# Patient Record
Sex: Female | Born: 1950
Health system: Southern US, Community
[De-identification: ages and names within clinical notes are randomized; demographics above are authoritative.]

## PROBLEM LIST (undated history)

## (undated) DIAGNOSIS — F320 Major depressive disorder, single episode, mild: Secondary | ICD-10-CM

## (undated) DIAGNOSIS — E785 Hyperlipidemia, unspecified: Secondary | ICD-10-CM

## (undated) DIAGNOSIS — H9193 Unspecified hearing loss, bilateral: Secondary | ICD-10-CM

## (undated) DIAGNOSIS — M545 Low back pain, unspecified: Secondary | ICD-10-CM

## (undated) DIAGNOSIS — N3010 Interstitial cystitis (chronic) without hematuria: Secondary | ICD-10-CM

## (undated) DIAGNOSIS — I10 Essential (primary) hypertension: Secondary | ICD-10-CM

## (undated) DIAGNOSIS — F419 Anxiety disorder, unspecified: Secondary | ICD-10-CM

## (undated) DIAGNOSIS — K219 Gastro-esophageal reflux disease without esophagitis: Secondary | ICD-10-CM

## (undated) DIAGNOSIS — G8929 Other chronic pain: Secondary | ICD-10-CM

## (undated) DIAGNOSIS — M1990 Unspecified osteoarthritis, unspecified site: Secondary | ICD-10-CM

## (undated) DIAGNOSIS — H902 Conductive hearing loss, unspecified: Secondary | ICD-10-CM

## (undated) DIAGNOSIS — D320 Benign neoplasm of cerebral meninges: Secondary | ICD-10-CM

## (undated) DIAGNOSIS — H9012 Conductive hearing loss, unilateral, left ear, with unrestricted hearing on the contralateral side: Secondary | ICD-10-CM

## (undated) DIAGNOSIS — R531 Weakness: Secondary | ICD-10-CM

## (undated) DIAGNOSIS — I5189 Other ill-defined heart diseases: Secondary | ICD-10-CM

## (undated) HISTORY — PX: TOTAL HIP ARTHROPLASTY: SHX124

## (undated) HISTORY — PX: CHOLECYSTECTOMY: SHX55

## (undated) HISTORY — PX: BACK SURGERY: SHX140

## (undated) HISTORY — PX: REPLACEMENT TOTAL KNEE: SUR1224

## (undated) HISTORY — DX: Benign neoplasm of cerebral meninges: D32.0

## (undated) HISTORY — PX: ABDOMINAL HYSTERECTOMY: SHX81

## (undated) HISTORY — DX: Other ill-defined heart diseases: I51.89

## (undated) HISTORY — DX: Essential (primary) hypertension: I10

---

## 1998-08-03 ENCOUNTER — Ambulatory Visit (HOSPITAL_BASED_OUTPATIENT_CLINIC_OR_DEPARTMENT_OTHER): Admission: RE | Admit: 1998-08-03 | Discharge: 1998-08-03 | Payer: Self-pay | Admitting: Orthopedic Surgery

## 1998-11-27 ENCOUNTER — Ambulatory Visit (HOSPITAL_BASED_OUTPATIENT_CLINIC_OR_DEPARTMENT_OTHER): Admission: RE | Admit: 1998-11-27 | Discharge: 1998-11-27 | Payer: Self-pay | Admitting: Urology

## 1999-08-23 ENCOUNTER — Ambulatory Visit (HOSPITAL_COMMUNITY): Admission: RE | Admit: 1999-08-23 | Discharge: 1999-08-23 | Payer: Self-pay | Admitting: Urology

## 1999-09-17 ENCOUNTER — Encounter: Admission: RE | Admit: 1999-09-17 | Discharge: 1999-09-17 | Payer: Self-pay | Admitting: Urology

## 2001-03-27 ENCOUNTER — Encounter: Payer: Self-pay | Admitting: Internal Medicine

## 2001-03-27 ENCOUNTER — Ambulatory Visit (HOSPITAL_COMMUNITY): Admission: RE | Admit: 2001-03-27 | Discharge: 2001-03-27 | Payer: Self-pay | Admitting: Internal Medicine

## 2001-09-10 ENCOUNTER — Ambulatory Visit (HOSPITAL_COMMUNITY): Admission: RE | Admit: 2001-09-10 | Discharge: 2001-09-10 | Payer: Self-pay | Admitting: Urology

## 2002-09-21 ENCOUNTER — Ambulatory Visit (HOSPITAL_BASED_OUTPATIENT_CLINIC_OR_DEPARTMENT_OTHER): Admission: RE | Admit: 2002-09-21 | Discharge: 2002-09-21 | Payer: Self-pay | Admitting: Urology

## 2003-02-18 ENCOUNTER — Encounter: Payer: Self-pay | Admitting: Orthopedic Surgery

## 2003-02-18 ENCOUNTER — Encounter: Admission: RE | Admit: 2003-02-18 | Discharge: 2003-02-18 | Payer: Self-pay | Admitting: Orthopedic Surgery

## 2003-09-01 ENCOUNTER — Encounter: Payer: Self-pay | Admitting: Internal Medicine

## 2003-09-01 ENCOUNTER — Ambulatory Visit (HOSPITAL_COMMUNITY): Admission: RE | Admit: 2003-09-01 | Discharge: 2003-09-01 | Payer: Self-pay | Admitting: Internal Medicine

## 2004-01-20 ENCOUNTER — Encounter (INDEPENDENT_AMBULATORY_CARE_PROVIDER_SITE_OTHER): Payer: Self-pay | Admitting: *Deleted

## 2004-01-20 ENCOUNTER — Observation Stay (HOSPITAL_COMMUNITY): Admission: RE | Admit: 2004-01-20 | Discharge: 2004-01-21 | Payer: Self-pay | Admitting: Orthopedic Surgery

## 2004-02-04 ENCOUNTER — Emergency Department (HOSPITAL_COMMUNITY): Admission: EM | Admit: 2004-02-04 | Discharge: 2004-02-04 | Payer: Self-pay | Admitting: Emergency Medicine

## 2004-05-22 ENCOUNTER — Encounter (HOSPITAL_COMMUNITY): Admission: RE | Admit: 2004-05-22 | Discharge: 2004-06-21 | Payer: Self-pay | Admitting: Orthopedic Surgery

## 2004-10-19 ENCOUNTER — Ambulatory Visit (HOSPITAL_COMMUNITY): Admission: RE | Admit: 2004-10-19 | Discharge: 2004-10-19 | Payer: Self-pay | Admitting: Neurosurgery

## 2004-11-17 ENCOUNTER — Emergency Department (HOSPITAL_COMMUNITY): Admission: EM | Admit: 2004-11-17 | Discharge: 2004-11-17 | Payer: Self-pay | Admitting: Emergency Medicine

## 2005-01-28 ENCOUNTER — Encounter: Admission: RE | Admit: 2005-01-28 | Discharge: 2005-01-28 | Payer: Self-pay | Admitting: Internal Medicine

## 2008-04-20 ENCOUNTER — Inpatient Hospital Stay (HOSPITAL_COMMUNITY): Admission: RE | Admit: 2008-04-20 | Discharge: 2008-04-23 | Payer: Self-pay | Admitting: Orthopedic Surgery

## 2008-05-16 ENCOUNTER — Encounter (HOSPITAL_COMMUNITY): Admission: RE | Admit: 2008-05-16 | Discharge: 2008-06-15 | Payer: Self-pay | Admitting: Orthopedic Surgery

## 2008-06-16 ENCOUNTER — Encounter (HOSPITAL_COMMUNITY): Admission: RE | Admit: 2008-06-16 | Discharge: 2008-07-16 | Payer: Self-pay | Admitting: Orthopedic Surgery

## 2008-12-03 ENCOUNTER — Emergency Department (HOSPITAL_COMMUNITY): Admission: EM | Admit: 2008-12-03 | Discharge: 2008-12-03 | Payer: Self-pay | Admitting: Emergency Medicine

## 2009-03-02 IMAGING — CR DG KNEE 1-2V PORT*R*
2 series · 2 of 2 positions shown · non-contrast
Comparison: None

CLINICAL DATA: Status post right total knee arthroplasty.

PORTABLE RIGHT KNEE - 1-2 VIEW

[view not recorded (1 of 2)]
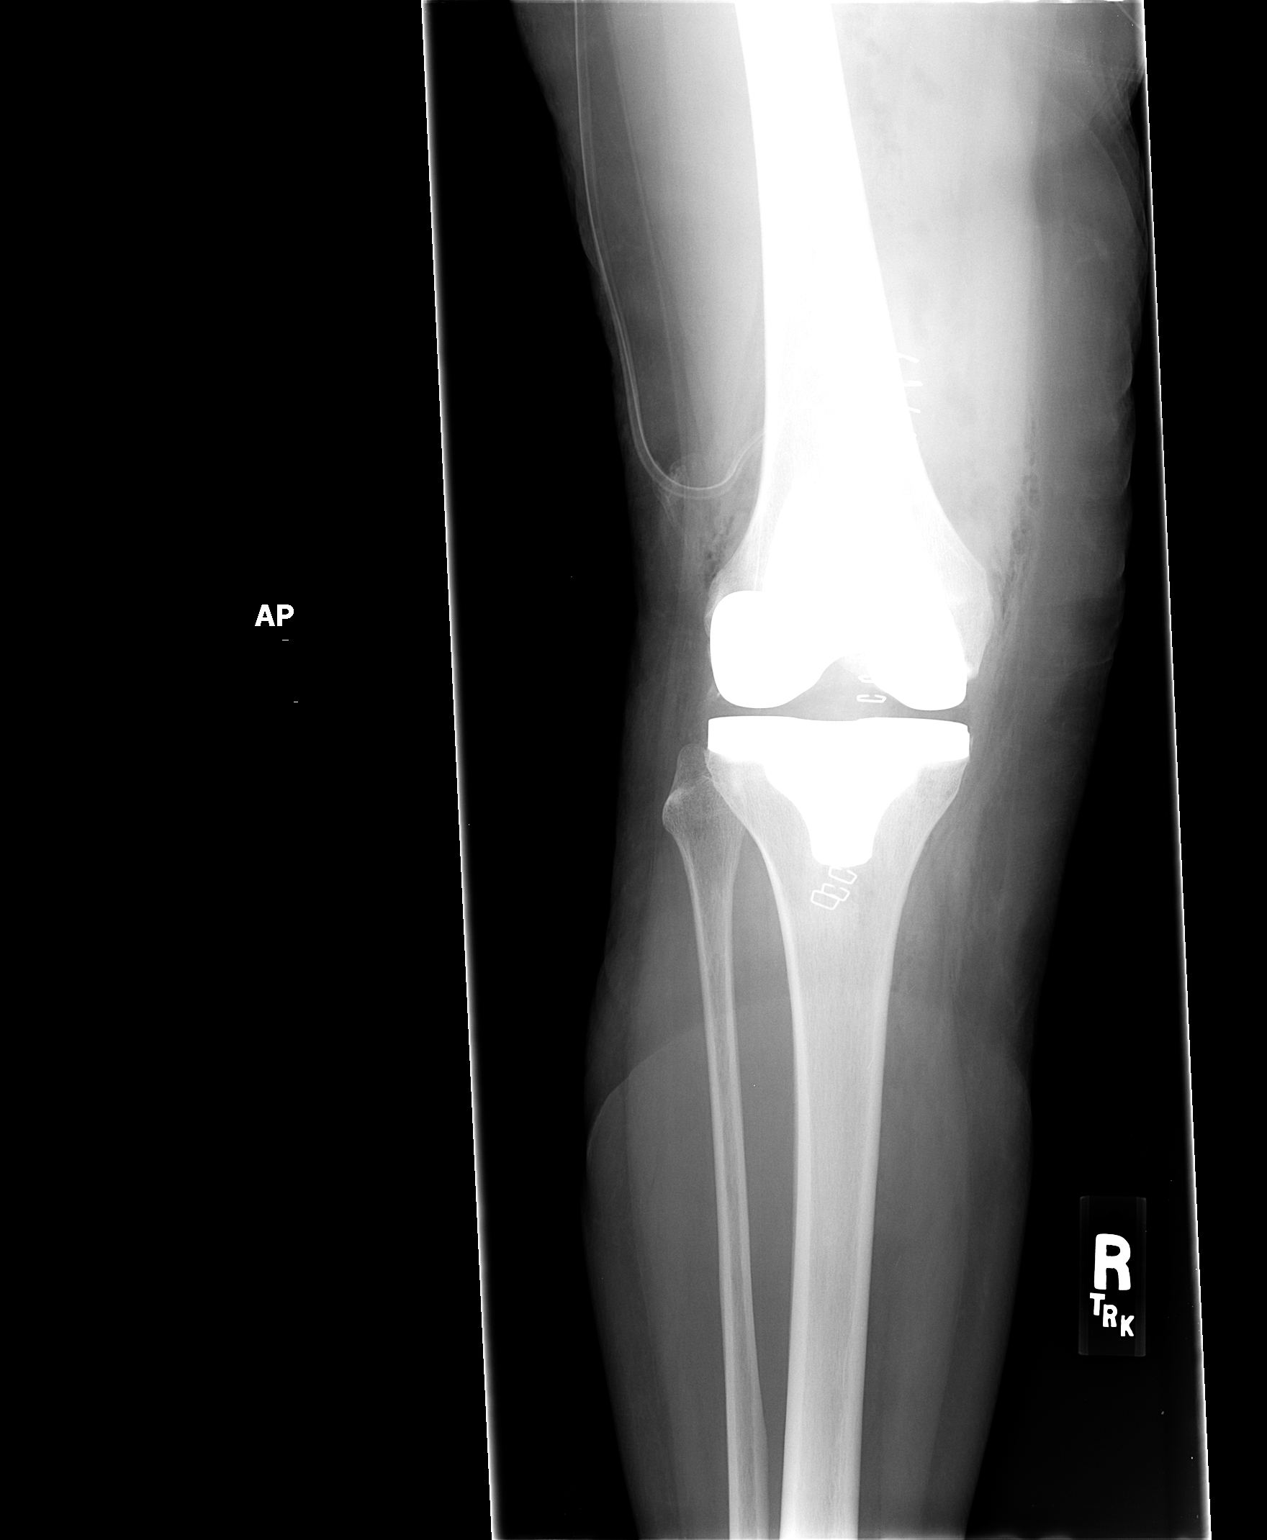

[view not recorded (2 of 2)]
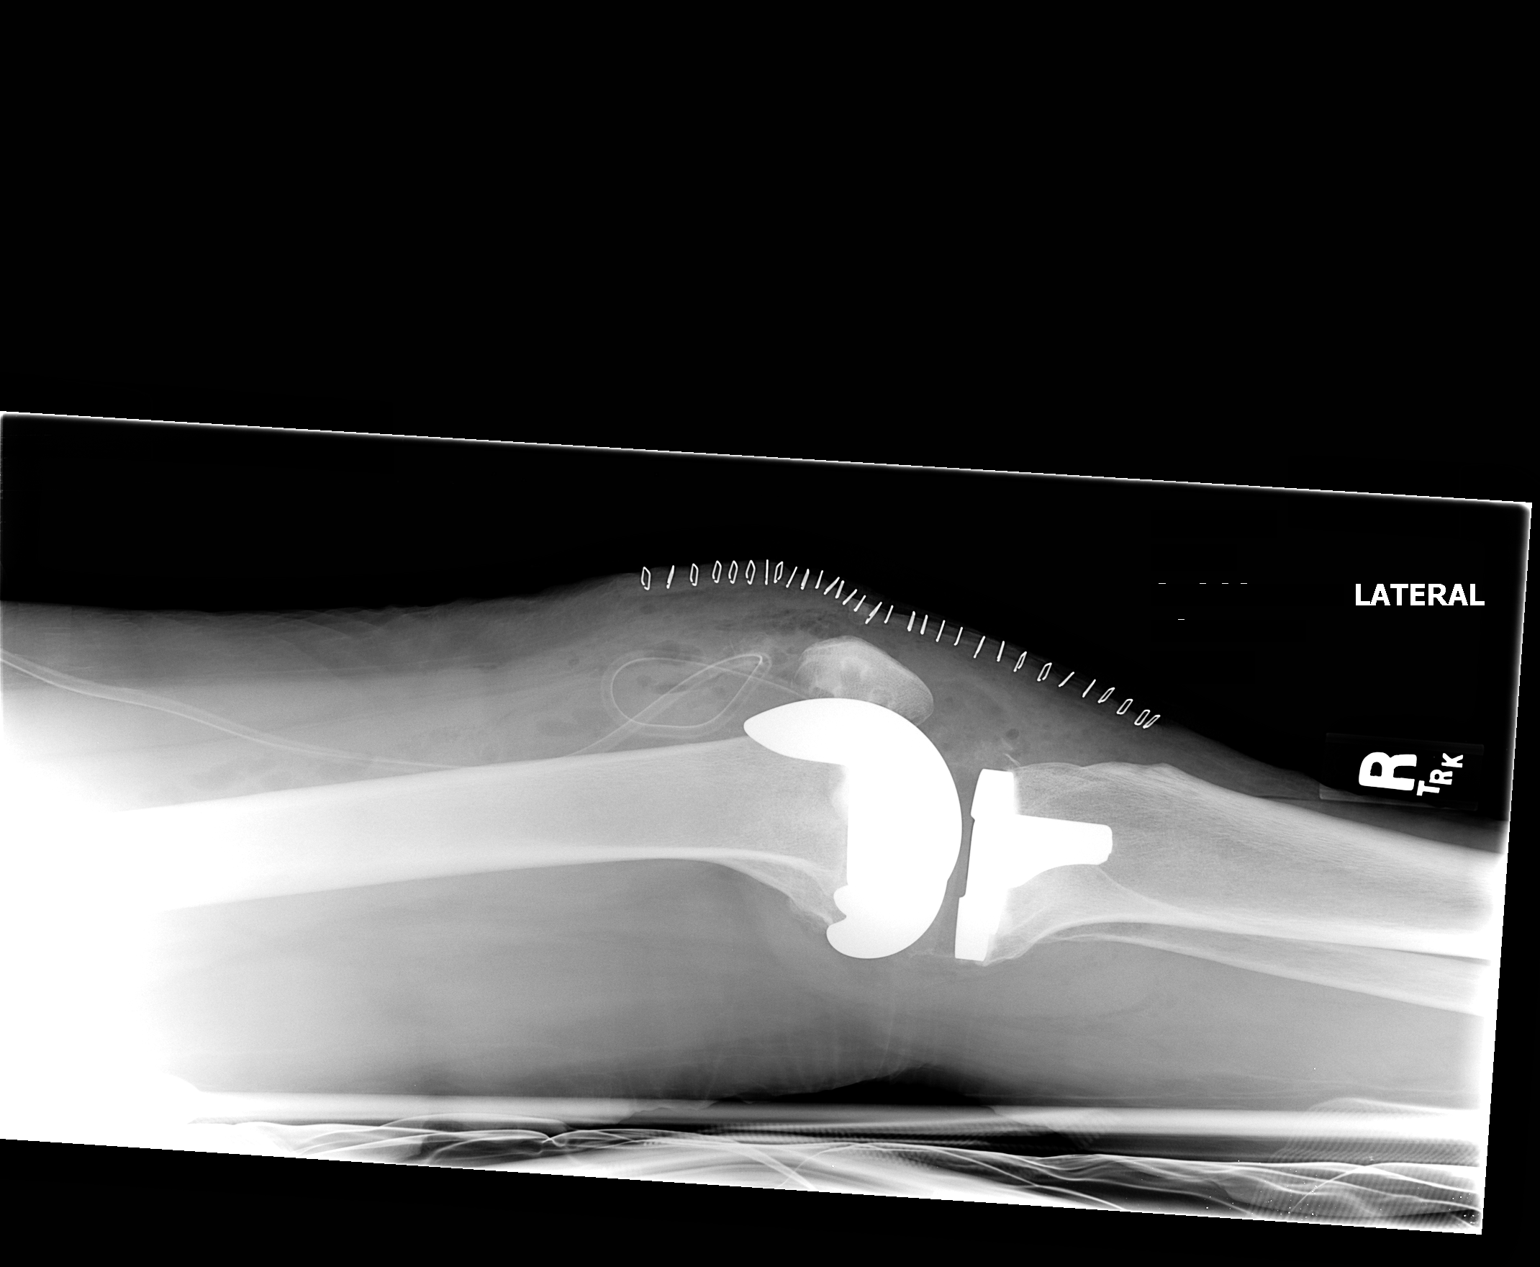

[2 of 2 positions shown; findings below may reference images not displayed]

FINDINGS: The hardware components of a right total knee
arthroplasty device are identified.

No complications are identified.  Specifically, no periprosthetic
fracture or dislocation noted.  The alignment is anatomic.
IMPRESSION: 1.  No complicating features after right total knee arthroplasty.

## 2010-03-29 ENCOUNTER — Encounter (HOSPITAL_COMMUNITY): Admission: RE | Admit: 2010-03-29 | Discharge: 2010-04-28 | Payer: Self-pay | Admitting: Anesthesiology

## 2010-07-03 ENCOUNTER — Encounter: Admission: RE | Admit: 2010-07-03 | Discharge: 2010-07-03 | Payer: Self-pay | Admitting: Internal Medicine

## 2010-12-23 ENCOUNTER — Encounter: Payer: Self-pay | Admitting: Internal Medicine

## 2011-01-19 ENCOUNTER — Emergency Department (HOSPITAL_COMMUNITY)
Admission: EM | Admit: 2011-01-19 | Discharge: 2011-01-19 | Disposition: A | Discharge status: Home / Self Care | Payer: 59 | Attending: Emergency Medicine | Admitting: Emergency Medicine

## 2011-01-19 DIAGNOSIS — R5381 Other malaise: Secondary | ICD-10-CM | POA: Insufficient documentation

## 2011-01-19 DIAGNOSIS — N3010 Interstitial cystitis (chronic) without hematuria: Secondary | ICD-10-CM | POA: Insufficient documentation

## 2011-01-19 DIAGNOSIS — IMO0001 Reserved for inherently not codable concepts without codable children: Secondary | ICD-10-CM | POA: Insufficient documentation

## 2011-01-19 DIAGNOSIS — J029 Acute pharyngitis, unspecified: Secondary | ICD-10-CM | POA: Insufficient documentation

## 2011-01-19 DIAGNOSIS — R509 Fever, unspecified: Secondary | ICD-10-CM | POA: Insufficient documentation

## 2011-01-19 LAB — RAPID STREP SCREEN (MED CTR MEBANE ONLY): Streptococcus, Group A Screen (Direct): NEGATIVE

## 2011-01-20 LAB — STREP A DNA PROBE: Group A Strep Probe: NEGATIVE

## 2011-04-16 NOTE — Discharge Summary (Signed)
NAMESUKHMANI, FETHEROLF                ACCOUNT NO.:  [id number]   MEDICAL RECORD NO.:  [id number]          PATIENT TYPE:  INP   LOCATION:  5038                         FACILITY:  [facility]   PHYSICIAN:  Loreta Ave, M.D. DATE OF BIRTH:  23-May-1951   DATE OF ADMISSION:  04/20/2008  DATE OF DISCHARGE:  04/23/2008                               DISCHARGE SUMMARY   FINAL DIAGNOSIS:  Status post right total knee replacement for end-stage  degenerative joint disease.   HOSPITAL COURSE:  On Apr 20, 2008, the patient was taken to the [facility] OR and a right total knee replacement procedure was performed.   SURGEON:  Loreta Ave, MD   ASSISTANT:  Zonia Kief, [facility]   ANESTHESIA:  General.   SPECIMENS:  None.   ESTIMATED BLOOD LOSS:  Minimal.   TOURNIQUET TIME:  One hour and 30 minutes.   DRAINS:  One Hemovac drain placed.   There were no surgical or anesthesia complications and the patient was  transferred to recovery in stable condition.  On Apr 21, 2008, the  patient was doing well.  Vital signs stable and afebrile.  Hemoglobin  10.2 and hematocrit 29.2.  INR 1.1.  Slight bleeding through her  dressing.  Calf nontender.  Neurovascularly intact.  Changed pain med to  Percocet 10/325, 1-2 tabs p.o. q.4-6 h. p.r.n. for pain.  Pharmacy  protocol, Coumadin started.  On Apr 22, 2008, the patient was doing  well.  Good pain control.  Temp 98.4, pulse 102, respirations 20, and  blood pressure 90/50.  Hemoglobin 8.8 and hematocrit 24.8.  INR 1.7.  Wound looks good, staples intact.  No drainage or signs of infection.  Calf nontender.  Neurovascularly intact.  Hemovac drain discontinued.  Started iron sulfate at 325 mg 1 tab p.o. b.i.d. with meals.  Discontinued the PCA and Foley. Saline-locked IV.  The patient did very  well on therapy.  On Apr 23, 2008, the patient without complaints.  Vital signs stable, afebrile.  Hemoglobin 9.0 and hematocrit 25.7.  Wound looks good, staples intact.   No drainage or signs of infection.  She had progressed well and was ready for discharge home.   CONDITION:  Good and stable.   DISPOSITION:  Discharge home.   MEDICATIONS:  1. Percocet 10/325, 1-2 tabs p.o. q.4-6 h. p.r.n. for pain.  2. Robaxin 500 mg one tab p.o. q.6 h. p.r.n. for spasms.  3. Lovenox 40 mg one subcu injection daily and stop when INR is 2-3.  4. Coumadin pharmacy protocol, maintain INR at 2-3.  5. Resume her previous home meds.   INSTRUCTIONS:  The patient will work with home health PT and OT to  improve ambulation and knee range of motion and strengthening.  Daily  dressing changes with 4 x 4 gauze and tape.  Coumadin x4 weeks  postoperative DVT prophylaxis.  Follow up in two weeks for postoperative  recheck.  Return sooner if needed.      Genene Churn. Denton Meek.      Loreta Ave, M.D.  Electronically Signed  JMO/MEDQ  D:  06/13/2008  T:  06/14/2008  Job:  [id number]

## 2011-04-16 NOTE — Op Note (Signed)
NAMESEREN, CHALOUX                ACCOUNT NO.:  [id number]   MEDICAL RECORD NO.:  [id number]          PATIENT TYPE:  INP   LOCATION:  5038                         FACILITY:  [facility]   PHYSICIAN:  Loreta Ave, M.D. DATE OF BIRTH:  1951-05-26   DATE OF PROCEDURE:  04/20/2008  DATE OF DISCHARGE:                               OPERATIVE REPORT   PREOPERATIVE DIAGNOSIS:  End-stage degenerative arthritis, right knee,  varus alignment.   POSTOPERATIVE DIAGNOSIS:  End-stage degenerative arthritis, right knee,  varus alignment.   PROCEDURE:  Right total knee replacement with Stryker Triathlon  prosthesis.  Modified minimally invasive vastus splitting incision.  Cemented pegged posterior stabilized #3 femoral component.  Cemented #3  tibial component with an 11-mm polyethylene insert posterior stabilized.  Resurfacing 29-mm x 9-mm cemented medial offset patellar component.  Soft tissue balancing with medial capsule release.   SURGEON:  Loreta Ave, MD   ASSISTANT:  Genene Churn. Barry Dienes, [place], present throughout the entire case  necessary for timely completion of procedure.   ANESTHESIA:  General.   BLOOD LOSS:  Minimal.   TOURNIQUET TIME:  1 hour 15 minutes.   SPECIMENS:  None.   CONSULTS:  None.   COMPLICATIONS:  None.   DRESSINGS:  Soft compressive knee immobilizer.   DRAIN:  Hemovac x1.   PROCEDURE:  The patient was brought to the operating room and placed on  the operating table in supine position.  After adequate anesthesia had  been obtained, knee examined.  Minimal flexion contracture and varus  alignment reasonably correctable.  Flexion 120 degrees.  Tourniquet  applied.  Prepped and draped in usual sterile fashion.  Exsanguinated,  elevation, Esmarch tourniquet was inflated at 350 mmHg.  Straight  incision above the patella down to tibial tubercle.  Medial arthrotomy  up to the superomedial border of patella and then vastus splitting for a  modified minimally  invasive approach.  Knee exposed.  Grade IV changes  medially.  Not quite as bad, but lesser extent changes in the  patellofemoral and lateral compartments.  Periarticular spurs remnants  of menisci, cruciate ligaments excised.  Medial capsule release.  Distal  femur exposed.  Intramedullary guide placed.  Distal cut was set at 5  degrees of valgus resecting 10 mm.  Using epicondylar axis sized, cut  and fitted for a posterior stabilized #3 component, which gave good  coverage and fitting throughout.  Attention was turned to the tibia.  Extramedullary guide.  Minimal proximal resection of 3-degree posterior  slope cut with extramedullary guide.  Size #3 component.  All recess  examined and all spurs removed.  Back of the femur had been assessed  before a trial had been put in place there.  Trials on the tibia and  femur were #3.  With the 11-mm insert, I had full extension and full  flexion, nicely balanced knee and normal mechanical axis, and no lift  off the flexion.  Utilizing trials, tibia was marked for appropriate  rotation.  Attention was turned to the patella.  Posterior aspect  exposed.  Posterior 9 mm removed with  a saw.  Size drilled and fitted  with a 29-mm patellar component.  Excellent patellofemoral tracking at  completion.  All trials removed.  Copious irrigation with a pulse  irrigating device.  Cement prepared and placed on all components, which  were firmly seated.  Polyethylene attached to tibia and the knee  reduced.  Once the cement had hardened and all the excessive cement  removed, the knee was reexamined.  Full extension, full flexion, good  alignment, good stability, and good patellofemoral tracking.  Wound  irrigated.  Hemovac placed through a separate stab wound  superolaterally.  Arthrotomy closed with #1 Vicryl.  Skin and  subcutaneous tissues with Vicryl and staples.  Knee injected with  Marcaine.  Hemovac clamped.  Sterile compressive dressing applied.   Tourniquet deflated, removed.  Knee immobilizer applied.  The anesthesia  reversed.  Brought to recovery room.  Tolerated surgery well.  No  complications.      Loreta Ave, M.D.  Electronically Signed     DFM/MEDQ  D:  04/21/2008  T:  04/22/2008  Job:  [id number]

## 2011-08-28 LAB — COMPREHENSIVE METABOLIC PANEL
ALT: 56 — ABNORMAL HIGH
AST: 47 — ABNORMAL HIGH
Calcium: 9.5
GFR calc Af Amer: >60
Potassium: 4.6
Sodium: 142
Total Protein: 6.9

## 2011-08-28 LAB — BASIC METABOLIC PANEL
BUN: 4 — ABNORMAL LOW
BUN: 5 — ABNORMAL LOW
CO2: 26
CO2: 27
Calcium: 8.2 — ABNORMAL LOW
Chloride: 106
Chloride: 106
Creatinine, Ser: 0.66
Glucose, Bld: 111 — ABNORMAL HIGH
Glucose, Bld: 128 — ABNORMAL HIGH
Glucose, Bld: 144 — ABNORMAL HIGH
Potassium: 3.7
Sodium: 140

## 2011-08-28 LAB — CBC
HCT: 24.8 — ABNORMAL LOW
HCT: 29.2 — ABNORMAL LOW
Hemoglobin: 8.8 — ABNORMAL LOW
MCHC: 34
MCHC: 35.1
MCHC: 35.3
MCV: 85.8
MCV: 86
Platelets: 155
Platelets: 156
RBC: 4.55
RDW: 13
RDW: 13.4
WBC: 7.5

## 2011-08-28 LAB — URINE MICROSCOPIC-ADD ON

## 2011-08-28 LAB — URINALYSIS, ROUTINE W REFLEX MICROSCOPIC
Glucose, UA: NEGATIVE
Specific Gravity, Urine: 1.013
pH: 5.5

## 2011-08-28 LAB — PROTIME-INR: Prothrombin Time: 18.5 — ABNORMAL HIGH

## 2011-08-28 LAB — ABO/RH: ABO/RH(D): B POS

## 2011-12-19 ENCOUNTER — Telehealth: Payer: Self-pay

## 2011-12-19 NOTE — Telephone Encounter (Signed)
Pt called back and I told her we would be calling her back to get her scheduled.

## 2011-12-19 NOTE — Telephone Encounter (Signed)
LM for pt to call. 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

## 2011-12-23 ENCOUNTER — Other Ambulatory Visit: Payer: Self-pay

## 2011-12-23 DIAGNOSIS — Z139 Encounter for screening, unspecified: Secondary | ICD-10-CM

## 2011-12-23 NOTE — Telephone Encounter (Signed)
Gastroenterology Pre-Procedure Form   Request Date: 12/23/2011                      Requesting Physician: Dr. Dwana Melena     PATIENT INFORMATION:  Rhonda Morales is a 61 y.o., female (DOB=1950/12/21).  PROCEDURE: Procedure(s) requested: colonoscopy Procedure Reason: screening for colon cancer  PATIENT REVIEW QUESTIONS: The patient reports the following:   1. Diabetes Melitis: no 2. Joint replacements in the past 12 months: no 3. Major health problems in the past 3 months: no 4. Has an artificial valve or MVP:no 5. Has been advised in past to take antibiotics in advance of a procedure like teeth cleaning: no}    MEDICATIONS & ALLERGIES:    Patient reports the following regarding taking any blood thinners:   Plavix? no Aspirin?no Coumadin?  no  Patient confirms/reports the following medications:  Current Outpatient Prescriptions  Medication Sig Dispense Refill  . ALPRAZolam (XANAX) 1 MG tablet Take 1 mg by mouth 3 (three) times daily as needed.      . Biotin 10 MG TABS Take by mouth.      . buprenorphine-naloxone (SUBOXONE) 8-2 MG SUBL Place under the tongue 2 (two) times daily.      . Multiple Vitamin (MULTIVITAMIN) tablet Take 1 tablet by mouth daily.        Patient confirms/reports the following allergies:  Allergies  Allergen Reactions  . Morphine And Related     Unsure of type of reaction    Patient is appropriate to schedule for requested procedure(s): yes  AUTHORIZATION INFORMATION Primary Insurance:   ID #:   Group #:  Pre-Cert / Auth required:  Pre-Cert / Auth #:   Secondary Insurance:   ID #:   Group #:  Pre-Cert / Auth required: Pre-Cert / Auth #:   No orders of the defined types were placed in this encounter.    SCHEDULE INFORMATION: Procedure has been scheduled as follows:  Date: 01/06/2012              Time: 9:15 AM  Location: [facility] Short Stay  This Gastroenterology Pre-Precedure Form is being routed to the following provider(s) for  review: Jonette Eva, MD    [facility] for pt to all to give date and time she is scheduled.

## 2011-12-25 NOTE — Telephone Encounter (Signed)
PT NEEDS AN APPT PRIOR TO TCS. PT NEEDS TO BE DONE WITH PROPOFOL DUE TO HER USE OF SUBOXONE.

## 2011-12-25 NOTE — Telephone Encounter (Signed)
Called pt and informed she needs OV appt prior to colonoscopy and will need to be done in OR. Scheduled for OV with Gerrit Halls, NP on 12/31/2011 @ 2:00 pm. Nash Dimmer, [facility] to cancel colonoscopy on 01/06/2012, and asked her to call me and confirm that she received the message.

## 2011-12-31 ENCOUNTER — Ambulatory Visit: Payer: 59 | Admitting: Gastroenterology

## 2012-01-06 ENCOUNTER — Encounter (HOSPITAL_COMMUNITY): Admission: RE | Payer: Self-pay | Source: Ambulatory Visit

## 2012-01-06 ENCOUNTER — Ambulatory Visit (HOSPITAL_COMMUNITY): Admission: RE | Admit: 2012-01-06 | Payer: 59 | Source: Ambulatory Visit | Admitting: Gastroenterology

## 2012-01-06 SURGERY — COLONOSCOPY
Anesthesia: Moderate Sedation

## 2012-03-04 ENCOUNTER — Ambulatory Visit: Payer: 59 | Admitting: Gastroenterology

## 2012-03-25 ENCOUNTER — Ambulatory Visit (HOSPITAL_COMMUNITY)
Admission: RE | Admit: 2012-03-25 | Discharge: 2012-03-25 | Disposition: A | Discharge status: Home / Self Care | Payer: 59 | Source: Ambulatory Visit | Attending: *Deleted | Admitting: *Deleted

## 2012-03-25 ENCOUNTER — Other Ambulatory Visit (HOSPITAL_COMMUNITY): Payer: Self-pay | Admitting: *Deleted

## 2012-03-25 DIAGNOSIS — R55 Syncope and collapse: Secondary | ICD-10-CM | POA: Insufficient documentation

## 2012-03-25 DIAGNOSIS — R51 Headache: Secondary | ICD-10-CM | POA: Insufficient documentation

## 2012-03-25 DIAGNOSIS — R42 Dizziness and giddiness: Secondary | ICD-10-CM

## 2014-01-10 DIAGNOSIS — Z0289 Encounter for other administrative examinations: Secondary | ICD-10-CM

## 2016-01-22 DIAGNOSIS — R262 Difficulty in walking, not elsewhere classified: Secondary | ICD-10-CM | POA: Diagnosis not present

## 2016-01-22 DIAGNOSIS — M1712 Unilateral primary osteoarthritis, left knee: Secondary | ICD-10-CM | POA: Diagnosis not present

## 2016-01-22 DIAGNOSIS — M25562 Pain in left knee: Secondary | ICD-10-CM | POA: Diagnosis not present

## 2016-02-01 DIAGNOSIS — M25562 Pain in left knee: Secondary | ICD-10-CM | POA: Diagnosis not present

## 2016-02-01 DIAGNOSIS — R2689 Other abnormalities of gait and mobility: Secondary | ICD-10-CM | POA: Diagnosis not present

## 2016-02-01 DIAGNOSIS — M1712 Unilateral primary osteoarthritis, left knee: Secondary | ICD-10-CM | POA: Diagnosis not present

## 2016-02-01 DIAGNOSIS — Z96651 Presence of right artificial knee joint: Secondary | ICD-10-CM | POA: Diagnosis not present

## 2016-02-01 DIAGNOSIS — R262 Difficulty in walking, not elsewhere classified: Secondary | ICD-10-CM | POA: Diagnosis not present

## 2016-02-01 DIAGNOSIS — M25661 Stiffness of right knee, not elsewhere classified: Secondary | ICD-10-CM | POA: Diagnosis not present

## 2016-02-06 DIAGNOSIS — F411 Generalized anxiety disorder: Secondary | ICD-10-CM | POA: Diagnosis not present

## 2016-02-06 DIAGNOSIS — R030 Elevated blood-pressure reading, without diagnosis of hypertension: Secondary | ICD-10-CM | POA: Diagnosis not present

## 2016-02-06 DIAGNOSIS — Z6826 Body mass index (BMI) 26.0-26.9, adult: Secondary | ICD-10-CM | POA: Diagnosis not present

## 2016-02-06 DIAGNOSIS — M25562 Pain in left knee: Secondary | ICD-10-CM | POA: Diagnosis not present

## 2016-02-06 DIAGNOSIS — E6609 Other obesity due to excess calories: Secondary | ICD-10-CM | POA: Diagnosis not present

## 2016-02-15 DIAGNOSIS — M1712 Unilateral primary osteoarthritis, left knee: Secondary | ICD-10-CM | POA: Diagnosis not present

## 2016-02-15 DIAGNOSIS — M25562 Pain in left knee: Secondary | ICD-10-CM | POA: Diagnosis not present

## 2016-02-22 DIAGNOSIS — R2689 Other abnormalities of gait and mobility: Secondary | ICD-10-CM | POA: Diagnosis not present

## 2016-02-22 DIAGNOSIS — M25562 Pain in left knee: Secondary | ICD-10-CM | POA: Diagnosis not present

## 2016-02-22 DIAGNOSIS — M1712 Unilateral primary osteoarthritis, left knee: Secondary | ICD-10-CM | POA: Diagnosis not present

## 2016-03-05 DIAGNOSIS — M1712 Unilateral primary osteoarthritis, left knee: Secondary | ICD-10-CM | POA: Diagnosis not present

## 2016-03-05 DIAGNOSIS — M25562 Pain in left knee: Secondary | ICD-10-CM | POA: Diagnosis not present

## 2016-03-18 DIAGNOSIS — M79669 Pain in unspecified lower leg: Secondary | ICD-10-CM | POA: Diagnosis not present

## 2016-04-19 DIAGNOSIS — G894 Chronic pain syndrome: Secondary | ICD-10-CM | POA: Diagnosis not present

## 2016-04-23 DIAGNOSIS — G894 Chronic pain syndrome: Secondary | ICD-10-CM | POA: Diagnosis not present

## 2016-05-03 DIAGNOSIS — E782 Mixed hyperlipidemia: Secondary | ICD-10-CM | POA: Diagnosis not present

## 2016-05-07 DIAGNOSIS — G894 Chronic pain syndrome: Secondary | ICD-10-CM | POA: Diagnosis not present

## 2016-05-07 DIAGNOSIS — F411 Generalized anxiety disorder: Secondary | ICD-10-CM | POA: Diagnosis not present

## 2016-05-07 DIAGNOSIS — N3010 Interstitial cystitis (chronic) without hematuria: Secondary | ICD-10-CM | POA: Diagnosis not present

## 2016-05-07 DIAGNOSIS — R030 Elevated blood-pressure reading, without diagnosis of hypertension: Secondary | ICD-10-CM | POA: Diagnosis not present

## 2016-08-21 ENCOUNTER — Other Ambulatory Visit (HOSPITAL_COMMUNITY): Payer: Self-pay | Admitting: Internal Medicine

## 2016-08-21 DIAGNOSIS — Z1231 Encounter for screening mammogram for malignant neoplasm of breast: Secondary | ICD-10-CM

## 2016-08-29 ENCOUNTER — Ambulatory Visit (HOSPITAL_COMMUNITY)
Admission: RE | Admit: 2016-08-29 | Discharge: 2016-08-29 | Disposition: A | Discharge status: Home / Self Care | Payer: PPO | Source: Ambulatory Visit | Attending: Internal Medicine | Admitting: Internal Medicine

## 2016-08-29 DIAGNOSIS — Z1231 Encounter for screening mammogram for malignant neoplasm of breast: Secondary | ICD-10-CM | POA: Insufficient documentation

## 2016-09-02 DIAGNOSIS — R030 Elevated blood-pressure reading, without diagnosis of hypertension: Secondary | ICD-10-CM | POA: Diagnosis not present

## 2016-09-02 DIAGNOSIS — G894 Chronic pain syndrome: Secondary | ICD-10-CM | POA: Diagnosis not present

## 2016-09-02 DIAGNOSIS — E6609 Other obesity due to excess calories: Secondary | ICD-10-CM | POA: Diagnosis not present

## 2016-09-02 DIAGNOSIS — N3010 Interstitial cystitis (chronic) without hematuria: Secondary | ICD-10-CM | POA: Diagnosis not present

## 2016-09-02 DIAGNOSIS — Z23 Encounter for immunization: Secondary | ICD-10-CM | POA: Diagnosis not present

## 2016-09-02 DIAGNOSIS — F411 Generalized anxiety disorder: Secondary | ICD-10-CM | POA: Diagnosis not present

## 2016-11-04 DIAGNOSIS — E782 Mixed hyperlipidemia: Secondary | ICD-10-CM | POA: Diagnosis not present

## 2016-11-06 DIAGNOSIS — G894 Chronic pain syndrome: Secondary | ICD-10-CM | POA: Diagnosis not present

## 2016-11-06 DIAGNOSIS — G589 Mononeuropathy, unspecified: Secondary | ICD-10-CM | POA: Diagnosis not present

## 2016-11-06 DIAGNOSIS — F411 Generalized anxiety disorder: Secondary | ICD-10-CM | POA: Diagnosis not present

## 2016-11-06 DIAGNOSIS — N3010 Interstitial cystitis (chronic) without hematuria: Secondary | ICD-10-CM | POA: Diagnosis not present

## 2016-11-06 DIAGNOSIS — R030 Elevated blood-pressure reading, without diagnosis of hypertension: Secondary | ICD-10-CM | POA: Diagnosis not present

## 2017-02-20 ENCOUNTER — Telehealth: Payer: Self-pay

## 2017-02-20 NOTE — Telephone Encounter (Signed)
PATIENT RECEIVED LETTER TO SCHEDULE A TCS PLEASE CALL BACK AT [phone]

## 2017-02-20 NOTE — Telephone Encounter (Signed)
See separate triage.  

## 2017-02-24 NOTE — Telephone Encounter (Signed)
Gastroenterology Pre-Procedure Review  Request Date: 02/20/2017 Requesting Physician: Dr. Wende Neighbors  PATIENT REVIEW QUESTIONS: The patient responded to the following health history questions as indicated:    Dr. Oneida Alar, previous note said pt needs OV prior to TCS due to Suboxone PT states she has been off of the Suboxone for about 2 years Please advise!  1. Diabetes Melitis: no 2. Joint replacements in the past 12 months: no 3. Major health problems in the past 3 months: no 4. Has an artificial valve or MVP: no 5. Has a defibrillator: no 6. Has been advised in past to take antibiotics in advance of a procedure like teeth cleaning: YES After a knee replacement 7. Family history of colon cancer: no  8. Alcohol Use: no 9. History of sleep apnea: no  10. History of coronary artery or other vascular stents placed within the last 12 months: no    MEDICATIONS & ALLERGIES:    Patient reports the following regarding taking any blood thinners:   Plavix? no Aspirin? no Coumadin? no Brilinta? no Xarelto? no Eliquis? no Pradaxa? no Savaysa? no Effient? no  Patient confirms/reports the following medications:  Current Outpatient Prescriptions  Medication Sig Dispense Refill  . ALPRAZolam (XANAX) 1 MG tablet Take 1 mg by mouth 3 (three) times daily as needed.    . fenofibrate micronized (LOFIBRA) 134 MG capsule Take 134 mg by mouth daily before breakfast.    . gabapentin (NEURONTIN) 300 MG capsule Take 300 mg by mouth 3 (three) times daily.    . mirabegron ER (MYRBETRIQ) 25 MG TB24 tablet Take 25 mg by mouth daily.    . Multiple Vitamin (MULTIVITAMIN) tablet Take 1 tablet by mouth daily.    . Biotin 10 MG TABS Take by mouth.     No current facility-administered medications for this visit.     Patient confirms/reports the following allergies:  Allergies  Allergen Reactions  . Hydrocodone Itching and Nausea Only  . Morphine And Related     Unsure of type of reaction    No orders  of the defined types were placed in this encounter.   AUTHORIZATION INFORMATION Primary Insurance:  ID #:  Group #:  Pre-Cert / Auth required Pre-Cert / Auth #:   Secondary Insurance:   ID #:  Group #:  Pre-Cert / Auth required:  Pre-Cert / Auth #:   SCHEDULE INFORMATION: Procedure has been scheduled as follows:  Date:               Time:   Location:   This Gastroenterology Pre-Precedure Review Form is being routed to the following provider(s): Barney Drain, MD

## 2017-02-24 NOTE — Telephone Encounter (Signed)
MOVI PREP SPLIT DOSING, REGULAR BREAKFAST. CLEAR LIQUIDS AFTER 9 AM.  

## 2017-02-25 NOTE — Telephone Encounter (Signed)
REVIEWED-NO ADDITIONAL RECOMMENDATIONS. 

## 2017-02-25 NOTE — Telephone Encounter (Signed)
Pt called and said she will not be able to do the colonoscopy at this time. She has a family member who has had the family called in. She will be back with me when she is ready to schedule. I am sending a letter to Dr. Nevada Crane.

## 2017-05-05 DIAGNOSIS — E782 Mixed hyperlipidemia: Secondary | ICD-10-CM | POA: Diagnosis not present

## 2017-05-07 DIAGNOSIS — N3010 Interstitial cystitis (chronic) without hematuria: Secondary | ICD-10-CM | POA: Diagnosis not present

## 2017-05-07 DIAGNOSIS — F411 Generalized anxiety disorder: Secondary | ICD-10-CM | POA: Diagnosis not present

## 2017-05-07 DIAGNOSIS — E6609 Other obesity due to excess calories: Secondary | ICD-10-CM | POA: Diagnosis not present

## 2017-05-07 DIAGNOSIS — Z6828 Body mass index (BMI) 28.0-28.9, adult: Secondary | ICD-10-CM | POA: Diagnosis not present

## 2017-05-07 DIAGNOSIS — I10 Essential (primary) hypertension: Secondary | ICD-10-CM | POA: Diagnosis not present

## 2017-05-07 DIAGNOSIS — E782 Mixed hyperlipidemia: Secondary | ICD-10-CM | POA: Diagnosis not present

## 2017-05-07 DIAGNOSIS — G894 Chronic pain syndrome: Secondary | ICD-10-CM | POA: Diagnosis not present

## 2017-10-13 DIAGNOSIS — M545 Low back pain: Secondary | ICD-10-CM | POA: Diagnosis not present

## 2017-10-13 DIAGNOSIS — I10 Essential (primary) hypertension: Secondary | ICD-10-CM | POA: Diagnosis not present

## 2017-10-13 DIAGNOSIS — H9193 Unspecified hearing loss, bilateral: Secondary | ICD-10-CM | POA: Diagnosis not present

## 2017-10-13 DIAGNOSIS — F320 Major depressive disorder, single episode, mild: Secondary | ICD-10-CM | POA: Diagnosis not present

## 2017-10-13 DIAGNOSIS — D320 Benign neoplasm of cerebral meninges: Secondary | ICD-10-CM | POA: Diagnosis not present

## 2017-10-13 DIAGNOSIS — G4701 Insomnia due to medical condition: Secondary | ICD-10-CM | POA: Diagnosis not present

## 2017-10-13 DIAGNOSIS — Z23 Encounter for immunization: Secondary | ICD-10-CM | POA: Diagnosis not present

## 2017-10-13 DIAGNOSIS — N3010 Interstitial cystitis (chronic) without hematuria: Secondary | ICD-10-CM | POA: Diagnosis not present

## 2017-10-13 DIAGNOSIS — F419 Anxiety disorder, unspecified: Secondary | ICD-10-CM | POA: Diagnosis not present

## 2017-10-13 DIAGNOSIS — M170 Bilateral primary osteoarthritis of knee: Secondary | ICD-10-CM | POA: Diagnosis not present

## 2017-10-14 DIAGNOSIS — F320 Major depressive disorder, single episode, mild: Secondary | ICD-10-CM | POA: Insufficient documentation

## 2017-10-14 DIAGNOSIS — M545 Low back pain, unspecified: Secondary | ICD-10-CM | POA: Insufficient documentation

## 2017-10-14 DIAGNOSIS — M1990 Unspecified osteoarthritis, unspecified site: Secondary | ICD-10-CM | POA: Insufficient documentation

## 2017-10-14 DIAGNOSIS — I10 Essential (primary) hypertension: Secondary | ICD-10-CM | POA: Insufficient documentation

## 2017-10-14 DIAGNOSIS — D320 Benign neoplasm of cerebral meninges: Secondary | ICD-10-CM | POA: Insufficient documentation

## 2017-10-14 DIAGNOSIS — H9193 Unspecified hearing loss, bilateral: Secondary | ICD-10-CM | POA: Insufficient documentation

## 2018-09-14 ENCOUNTER — Other Ambulatory Visit (HOSPITAL_BASED_OUTPATIENT_CLINIC_OR_DEPARTMENT_OTHER): Payer: Self-pay

## 2018-09-14 DIAGNOSIS — G4733 Obstructive sleep apnea (adult) (pediatric): Secondary | ICD-10-CM

## 2018-09-21 ENCOUNTER — Ambulatory Visit: Payer: Medicare Other | Attending: Neurology | Admitting: Neurology

## 2018-09-21 DIAGNOSIS — G4733 Obstructive sleep apnea (adult) (pediatric): Secondary | ICD-10-CM | POA: Insufficient documentation

## 2018-10-03 NOTE — Procedures (Signed)
   Newald A. Merlene Laughter, MD     [email or web address]             NOCTURNAL POLYSOMNOGRAPHY   LOCATION: [facility]   Patient Name: Rhonda Morales, Rhonda Morales Date: 09/21/2018 Gender: Female D.O.B: 24-Jan-1951 Age (years): 33 Referring Provider: Phillips Odor MD, ABSM Height (inches): 62 Interpreting Physician: Phillips Odor MD, ABSM Weight (lbs): 148 RPSGT: Rosebud Poles BMI: 27 MRN: [id number] Neck Size: 14.00 CLINICAL INFORMATION Sleep Study Type: NPSG     Indication for sleep study: N/A     Epworth Sleepiness Score: 5     SLEEP STUDY TECHNIQUE As per the AASM Manual for the Scoring of Sleep and Associated Events v2.3 (April 2016) with a hypopnea requiring 4% desaturations.  The channels recorded and monitored were frontal, central and occipital EEG, electrooculogram (EOG), submentalis EMG (chin), nasal and oral airflow, thoracic and abdominal wall motion, anterior tibialis EMG, snore microphone, electrocardiogram, and pulse oximetry.  MEDICATIONS Medications self-administered by patient taken the night of the study : N/A  Current Outpatient Medications:  .  ALPRAZolam (XANAX) 1 MG tablet, Take 1 mg by mouth 3 (three) times daily as needed., Disp: , Rfl:  .  Biotin 10 MG TABS, Take by mouth., Disp: , Rfl:  .  fenofibrate micronized (LOFIBRA) 134 MG capsule, Take 134 mg by mouth daily before breakfast., Disp: , Rfl:  .  gabapentin (NEURONTIN) 300 MG capsule, Take 300 mg by mouth 3 (three) times daily., Disp: , Rfl:  .  mirabegron ER (MYRBETRIQ) 25 MG TB24 tablet, Take 25 mg by mouth daily., Disp: , Rfl:  .  Multiple Vitamin (MULTIVITAMIN) tablet, Take 1 tablet by mouth daily., Disp: , Rfl:      SLEEP ARCHITECTURE The study was initiated at 10:29:35 PM and ended at 5:00:32 AM.  Sleep onset time was 25.8 minutes and the sleep efficiency was 78.3%%. The total sleep time was 306 minutes.  Stage REM latency was 307.5 minutes.  The patient spent  2.5%% of the night in stage N1 sleep, 69.8%% in stage N2 sleep, 26.8%% in stage N3 and 1% in REM.  Alpha intrusion was absent.  Supine sleep was 73.04%.  RESPIRATORY PARAMETERS The overall apnea/hypopnea index (AHI) was 5.5 per hour. There were 1 total apneas, including 1 obstructive, 0 central and 0 mixed apneas. There were 27 hypopneas and 0 RERAs.  The AHI during Stage REM sleep was 20.0 per hour.  AHI while supine was 6.7 per hour.  The mean oxygen saturation was 91.4%. The minimum SpO2 during sleep was 86.0%.  moderate snoring was noted during this study.  CARDIAC DATA The 2 lead EKG demonstrated sinus rhythm. The mean heart rate was 68.9 beats per minute. Other EKG findings include: PVCs.  LEG MOVEMENT DATA The total PLMS were 0 with a resulting PLMS index of 0.0. Associated arousal with leg movement index was 0.0.  IMPRESSIONS Mild obstructive sleep apnea worse during REM sleep is documented with this study. The severity does not require positive pressure treatment.   Delano Metz, MD Diplomate, American Board of Sleep Medicine. ELECTRONICALLY SIGNED ON:  10/03/2018, 12:22 PM [place] PH: [phone]   FX: [phone] [place]

## 2018-11-05 ENCOUNTER — Other Ambulatory Visit (HOSPITAL_COMMUNITY): Payer: Self-pay | Admitting: Pain Medicine

## 2018-11-05 DIAGNOSIS — M1712 Unilateral primary osteoarthritis, left knee: Secondary | ICD-10-CM

## 2018-11-11 ENCOUNTER — Ambulatory Visit (HOSPITAL_COMMUNITY)
Admission: RE | Admit: 2018-11-11 | Discharge: 2018-11-11 | Disposition: A | Discharge status: Home / Self Care | Payer: Medicare Other | Source: Ambulatory Visit | Attending: Pain Medicine | Admitting: Pain Medicine

## 2018-11-11 DIAGNOSIS — M1712 Unilateral primary osteoarthritis, left knee: Secondary | ICD-10-CM | POA: Diagnosis not present

## 2019-04-27 ENCOUNTER — Other Ambulatory Visit: Payer: Self-pay

## 2019-04-27 ENCOUNTER — Other Ambulatory Visit (HOSPITAL_COMMUNITY): Payer: Self-pay | Admitting: Physician Assistant

## 2019-04-27 ENCOUNTER — Ambulatory Visit (HOSPITAL_COMMUNITY)
Admission: RE | Admit: 2019-04-27 | Discharge: 2019-04-27 | Disposition: A | Discharge status: Home / Self Care | Payer: Medicare Other | Source: Ambulatory Visit | Attending: Physician Assistant | Admitting: Physician Assistant

## 2019-04-27 DIAGNOSIS — M79672 Pain in left foot: Secondary | ICD-10-CM | POA: Diagnosis not present

## 2019-09-10 ENCOUNTER — Other Ambulatory Visit: Payer: Self-pay

## 2019-09-10 DIAGNOSIS — Z20822 Contact with and (suspected) exposure to covid-19: Secondary | ICD-10-CM

## 2019-09-12 LAB — NOVEL CORONAVIRUS, NAA: SARS-CoV-2, NAA: NOT DETECTED

## 2019-09-13 ENCOUNTER — Telehealth: Payer: Self-pay | Admitting: General Practice

## 2019-09-13 NOTE — Telephone Encounter (Signed)
Negative COVID results given. Patient results "NOT Detected." Caller expressed understanding. ° °

## 2019-10-19 ENCOUNTER — Ambulatory Visit (INDEPENDENT_AMBULATORY_CARE_PROVIDER_SITE_OTHER): Payer: Medicare Other | Admitting: Urology

## 2019-10-19 ENCOUNTER — Other Ambulatory Visit (HOSPITAL_COMMUNITY)
Admission: RE | Admit: 2019-10-19 | Discharge: 2019-10-19 | Disposition: A | Discharge status: Home / Self Care | Payer: Medicare Other | Source: Ambulatory Visit | Attending: Urology | Admitting: Urology

## 2019-10-19 DIAGNOSIS — N3000 Acute cystitis without hematuria: Secondary | ICD-10-CM | POA: Insufficient documentation

## 2019-10-19 DIAGNOSIS — N952 Postmenopausal atrophic vaginitis: Secondary | ICD-10-CM

## 2019-10-21 LAB — URINE CULTURE: Culture: >=100000 — AB

## 2019-12-01 ENCOUNTER — Ambulatory Visit: Payer: Medicare Other

## 2019-12-01 ENCOUNTER — Other Ambulatory Visit (HOSPITAL_COMMUNITY)
Admission: RE | Admit: 2019-12-01 | Discharge: 2019-12-01 | Disposition: A | Discharge status: Home / Self Care | Payer: Medicare Other | Source: Ambulatory Visit | Attending: Urology | Admitting: Urology

## 2019-12-01 ENCOUNTER — Other Ambulatory Visit: Payer: Self-pay

## 2019-12-01 DIAGNOSIS — N3000 Acute cystitis without hematuria: Secondary | ICD-10-CM

## 2019-12-02 LAB — URINE CULTURE: Culture: 30000 — AB

## 2019-12-07 ENCOUNTER — Telehealth: Payer: Self-pay | Admitting: Urology

## 2019-12-07 NOTE — Telephone Encounter (Signed)
Status:  Final result   Visible to patient:  No (inaccessible in MyChart) Next appt:  01/18/2020 at 11:15 AM in Urology Jorja Loa, MD) Specimen Information: Urine, Clean Catch      Component 6 d ago  Specimen Description URINE, CLEAN CATCH  Performed at [facility], [address], [place], [place] [address]   Special Requests NONE  Performed at [facility], [address], [place], [place] [address]   Culture Abnormal  30,000 COLONIES/mL GROUP B STREP(S.AGALACTIAE)ISOLATED  TESTING AGAINST S. AGALACTIAE NOT ROUTINELY PERFORMED DUE TO PREDICTABILITY OF AMP/PEN/VAN SUSCEPTIBILITY.  Performed at [facility], [place] [address], [place], [place] [address]

## 2019-12-07 NOTE — Telephone Encounter (Signed)
Pt called for results of urine sample she brought by last Wednesday.

## 2020-01-18 ENCOUNTER — Ambulatory Visit (INDEPENDENT_AMBULATORY_CARE_PROVIDER_SITE_OTHER): Payer: Medicare Other | Admitting: Urology

## 2020-01-18 ENCOUNTER — Encounter: Payer: Self-pay | Admitting: Urology

## 2020-01-18 ENCOUNTER — Other Ambulatory Visit: Payer: Self-pay

## 2020-01-18 VITALS — BP 187/84 | HR 93 | Temp 96.3°F | Ht 62.0 in | Wt 150.0 lb

## 2020-01-18 DIAGNOSIS — N3000 Acute cystitis without hematuria: Secondary | ICD-10-CM

## 2020-01-18 LAB — POCT URINALYSIS DIPSTICK
Bilirubin, UA: NEGATIVE
Blood, UA: NEGATIVE
Glucose, UA: NEGATIVE
Ketones, UA: NEGATIVE
Spec Grav, UA: >=1.03 — AB (ref 1.010–1.025)
pH, UA: 5 (ref 5.0–8.0)

## 2020-01-18 NOTE — Progress Notes (Signed)
Urological Symptom Review  Patient is experiencing the following symptoms: Frequent urination Hard to postpone urination Burning/pain with urination Get up at night to urinate Leakage of urine Stream starts and stops Trouble starting stream Have to strain to urinate Painful intercourse Weak stream   Review of Systems  Gastrointestinal (upper)  : Negative for upper GI symptoms  Gastrointestinal (lower) : Negative for lower GI symptoms  Constitutional : Negative for symptoms  Skin: Negative for skin symptoms  Eyes: Negative for eye symptoms  Ear/Nose/Throat : Negative for Ear/Nose/Throat symptoms  Hematologic/Lymphatic: Negative for Hematologic/Lymphatic symptoms  Cardiovascular : Negative for cardiovascular symptoms  Respiratory : Negative for respiratory symptoms  Endocrine: Negative for endocrine symptoms  Musculoskeletal: Back pain Joint pain  Neurological: Negative for neurological symptoms  Psychologic: Anxiety

## 2020-01-18 NOTE — Progress Notes (Signed)
H&P  Chief Complaint: Acute Cystitis  History of Present Illness: Rhonda Morales is a 69 y.o. year old female  2.16.2021: Here today for follow-up having been instructed to start on estrogen cream and daily probiotic and to increase her fluid intake in efforts to reduce her rate of recurrent infection. She states that in the interim, she has been doing very well but does report that she believes she is allergic to the estrogen cream she was prescribed. She used it for 2 weeks, 2-3x weekly before she noticed that her skin became irritated with multiple small bumps -- she has discontinued this. She does note that she has also begun taking "extra showers" to try to keep as clean as possible. She did drop off a urine specimen for analysis this last December but this was negative for any signs of infection.   (below copied from [facility] records):  Acute Cystitis:   Rhonda Morales is a 69 year-old female patient who is here for acute cystitis.  She does have a burning sensation when she urinates. She does have pelvic or rectal pain related to voiding. She does have recurrent infections . She has had 6 infections per year.   She is having problems with urinary control or incontinence.   11.17.2020: She presents today after having been seen prior by Dr Jeffie Pollock for interstitial cystitis -- here today after 2 back to back UTI's. She reports having had high fever, chills, back pain, dysuria, and GI sx's. She was eventually diagnosed with kidney infections -- she was started on macrobid, switched to keflex (this made her nauseous), and was put back on another round of macrobid. She believes she has an infection now (current dysuria) but is unsure as it could also be sx's of her interstitial cystitis. She has had 6 UTI's this year (this is her annual average). She is no longer sexually active and is using specialty soap and only wearing white undergarments to prevent infection. She previously tried cranberry pills but  stopped due to their acidity.    No past medical history on file.    Home Medications:  (Not in a hospital admission)   Allergies:  Allergies  Allergen Reactions  . Hydrocodone Itching and Nausea Only  . Estradiol Other (See Comments)    Cream burned  . Morphine And Related     Unsure of type of reaction    No family history on file.  Social History:  has no history on file for tobacco, alcohol, and drug.  ROS: A complete review of systems was performed.  All systems are negative except for pertinent findings as noted.  Physical Exam:  Vital signs in last 24 hours: Temp:  [96.3 F (35.7 C)] 96.3 F (35.7 C) (02/16 1113) Pulse Rate:  [93] 93 (02/16 1113) BP: (187)/(84) 187/84 (02/16 1113) Weight:  [150 lb (68 kg)] 150 lb (68 kg) (02/16 1113) General:  Alert and oriented, No acute distress HEENT: Normocephalic, atraumatic Neck: No JVD or lymphadenopathy Cardiovascular: Regular rate and rhythm Lungs: Clear bilaterally Abdomen: Soft, nontender, nondistended, no abdominal masses Back: No CVA tenderness Extremities: No edema Neurologic: Grossly intact  Laboratory Data:  Results for orders placed or performed in visit on 01/18/20 (from the past 24 hour(s))  POCT urinalysis dipstick     Status: Abnormal   Collection Time: 01/18/20 11:36 AM  Result Value Ref Range   Color, UA reddish tinted    Clarity, UA clear    Glucose, UA Negative Negative  Bilirubin, UA neg    Ketones, UA neg    Spec Grav, UA >=1.030 (A) 1.010 - 1.025   Blood, UA neg    pH, UA 5.0 5.0 - 8.0   Protein, UA     Urobilinogen, UA     Nitrite, UA     Leukocytes, UA     Appearance     Odor       I have reviewed prior pt notes  I have reviewed notes from referring/previous physicians  I have reviewed urinalysis results  I have reviewed prior urine culture  Impression/Assessment:  UA today looks much clearer than last visit. She does not feel as though she is currently infected and  overall has felt like she has not have many UTI's in the interim. She likely was sensitive to the suspension medium of the estrogen cream or some other non-active component -- will replace this with Replens.   Plan:  1. She is fine to remain off estrogen cream -- advised to start on Replens cream 2-3x a week.   2. Return PRN -- she will call if she feels as though she is becoming symptomatic of an infection.   Dena Billet 01/18/2020, 12:01 PM  Lillette Boxer. Renette Hsu MD

## 2020-01-20 ENCOUNTER — Encounter: Payer: Self-pay | Admitting: Urology

## 2020-04-11 DIAGNOSIS — H9012 Conductive hearing loss, unilateral, left ear, with unrestricted hearing on the contralateral side: Secondary | ICD-10-CM | POA: Insufficient documentation

## 2020-07-17 ENCOUNTER — Telehealth: Payer: Self-pay | Admitting: Physician Assistant

## 2020-07-17 NOTE — Telephone Encounter (Signed)
Called to discuss the homebound Covid-19 vaccination initiative with the patient and/or caregiver.   Pt said she did not want the vaccine and hung up on me  Angelena Form PA-C  [facility]

## 2020-08-09 ENCOUNTER — Other Ambulatory Visit: Payer: Medicare Other

## 2020-08-09 ENCOUNTER — Other Ambulatory Visit: Payer: Self-pay

## 2020-08-09 DIAGNOSIS — N3000 Acute cystitis without hematuria: Secondary | ICD-10-CM

## 2020-08-09 LAB — URINALYSIS, ROUTINE W REFLEX MICROSCOPIC
Bilirubin, UA: NEGATIVE
Glucose, UA: NEGATIVE
Ketones, UA: NEGATIVE
Nitrite, UA: NEGATIVE
Protein,UA: NEGATIVE
Specific Gravity, UA: 1.01 (ref 1.005–1.030)
Urobilinogen, Ur: 0.2 mg/dL (ref 0.2–1.0)
pH, UA: 6 (ref 5.0–7.5)

## 2020-08-09 LAB — MICROSCOPIC EXAMINATION: WBC, UA: >30 /hpf — AB (ref 0–5)

## 2020-08-15 ENCOUNTER — Other Ambulatory Visit: Payer: Self-pay | Admitting: Urology

## 2020-08-15 DIAGNOSIS — N3000 Acute cystitis without hematuria: Secondary | ICD-10-CM

## 2020-08-15 LAB — URINE CULTURE

## 2020-08-15 MED ORDER — NITROFURANTOIN MONOHYD MACRO 100 MG PO CAPS: 100.0000 mg | ORAL_CAPSULE | Freq: Two times a day (BID) | ORAL | 0 refills | Status: AC

## 2020-08-16 ENCOUNTER — Telehealth: Payer: Self-pay

## 2020-08-16 NOTE — Telephone Encounter (Signed)
-----   Message from Franchot Gallo, MD sent at 08/15/2020  4:36 PM EDT ----- Abx sent in ----- Message ----- From: Dorisann Frames, RN Sent: 08/15/2020   3:42 PM EDT To: Franchot Gallo, MD  Please review

## 2020-08-16 NOTE — Telephone Encounter (Signed)
Pt called and notified

## 2020-10-06 DIAGNOSIS — Z79891 Long term (current) use of opiate analgesic: Secondary | ICD-10-CM

## 2021-05-04 ENCOUNTER — Encounter (HOSPITAL_COMMUNITY): Payer: Self-pay | Admitting: *Deleted

## 2021-05-04 ENCOUNTER — Inpatient Hospital Stay (HOSPITAL_COMMUNITY)
Admission: EM | Admit: 2021-05-04 | Discharge: 2021-05-06 | DRG: 689 | Disposition: A | Discharge status: Home / Self Care | Payer: Medicare Other | Attending: Internal Medicine | Admitting: Internal Medicine

## 2021-05-04 ENCOUNTER — Emergency Department (HOSPITAL_COMMUNITY): Payer: Medicare Other

## 2021-05-04 DIAGNOSIS — N390 Urinary tract infection, site not specified: Secondary | ICD-10-CM | POA: Diagnosis present

## 2021-05-04 DIAGNOSIS — F419 Anxiety disorder, unspecified: Secondary | ICD-10-CM | POA: Diagnosis present

## 2021-05-04 DIAGNOSIS — E785 Hyperlipidemia, unspecified: Secondary | ICD-10-CM | POA: Diagnosis present

## 2021-05-04 DIAGNOSIS — R410 Disorientation, unspecified: Secondary | ICD-10-CM | POA: Diagnosis present

## 2021-05-04 DIAGNOSIS — Z888 Allergy status to other drugs, medicaments and biological substances status: Secondary | ICD-10-CM | POA: Diagnosis not present

## 2021-05-04 DIAGNOSIS — G9341 Metabolic encephalopathy: Secondary | ICD-10-CM | POA: Diagnosis present

## 2021-05-04 DIAGNOSIS — R4182 Altered mental status, unspecified: Secondary | ICD-10-CM | POA: Diagnosis present

## 2021-05-04 DIAGNOSIS — Z79899 Other long term (current) drug therapy: Secondary | ICD-10-CM | POA: Diagnosis not present

## 2021-05-04 DIAGNOSIS — Z885 Allergy status to narcotic agent status: Secondary | ICD-10-CM

## 2021-05-04 DIAGNOSIS — G8929 Other chronic pain: Secondary | ICD-10-CM | POA: Diagnosis present

## 2021-05-04 DIAGNOSIS — R441 Visual hallucinations: Secondary | ICD-10-CM | POA: Diagnosis present

## 2021-05-04 DIAGNOSIS — N3010 Interstitial cystitis (chronic) without hematuria: Principal | ICD-10-CM | POA: Diagnosis present

## 2021-05-04 DIAGNOSIS — Z20822 Contact with and (suspected) exposure to covid-19: Secondary | ICD-10-CM | POA: Diagnosis present

## 2021-05-04 HISTORY — DX: Other chronic pain: G89.29

## 2021-05-04 HISTORY — DX: Anxiety disorder, unspecified: F41.9

## 2021-05-04 HISTORY — DX: Hyperlipidemia, unspecified: E78.5

## 2021-05-04 HISTORY — DX: Interstitial cystitis (chronic) without hematuria: N30.10

## 2021-05-04 LAB — COMPREHENSIVE METABOLIC PANEL
ALT: 34 U/L (ref 0–44)
AST: 19 U/L (ref 15–41)
Albumin: 4.2 g/dL (ref 3.5–5.0)
Alkaline Phosphatase: 80 U/L (ref 38–126)
Anion gap: 9 (ref 5–15)
BUN: 19 mg/dL (ref 8–23)
CO2: 24 mmol/L (ref 22–32)
Calcium: 9.6 mg/dL (ref 8.9–10.3)
Chloride: 103 mmol/L (ref 98–111)
Creatinine, Ser: 0.79 mg/dL (ref 0.44–1.00)
GFR, Estimated: >60 mL/min (ref >60)
Glucose, Bld: 148 mg/dL — ABNORMAL HIGH (ref 70–99)
Potassium: 3.7 mmol/L (ref 3.5–5.1)
Sodium: 136 mmol/L (ref 135–145)
Total Bilirubin: 0.7 mg/dL (ref 0.3–1.2)
Total Protein: 7.9 g/dL (ref 6.5–8.1)

## 2021-05-04 LAB — URINALYSIS, ROUTINE W REFLEX MICROSCOPIC
Bilirubin Urine: NEGATIVE
Glucose, UA: NEGATIVE mg/dL
Ketones, ur: NEGATIVE mg/dL
Nitrite: NEGATIVE
Protein, ur: NEGATIVE mg/dL
Specific Gravity, Urine: 1.023 (ref 1.005–1.030)
WBC, UA: >50 WBC/hpf — ABNORMAL HIGH (ref 0–5)
pH: 5 (ref 5.0–8.0)

## 2021-05-04 LAB — RAPID URINE DRUG SCREEN, HOSP PERFORMED
Amphetamines: NOT DETECTED
Barbiturates: NOT DETECTED
Benzodiazepines: POSITIVE — AB
Cocaine: NOT DETECTED
Opiates: NOT DETECTED
Tetrahydrocannabinol: NOT DETECTED

## 2021-05-04 LAB — CBC WITH DIFFERENTIAL/PLATELET
Abs Immature Granulocytes: 0.09 10*3/uL — ABNORMAL HIGH (ref 0.00–0.07)
Basophils Absolute: 0.1 10*3/uL (ref 0.0–0.1)
Basophils Relative: 0 %
Eosinophils Absolute: 0.1 10*3/uL (ref 0.0–0.5)
Eosinophils Relative: 1 %
HCT: 42.2 % (ref 36.0–46.0)
Hemoglobin: 13.6 g/dL (ref 12.0–15.0)
Immature Granulocytes: 1 %
Lymphocytes Relative: 19 %
Lymphs Abs: 2.5 10*3/uL (ref 0.7–4.0)
MCH: 27.7 pg (ref 26.0–34.0)
MCHC: 32.2 g/dL (ref 30.0–36.0)
MCV: 85.9 fL (ref 80.0–100.0)
Monocytes Absolute: 1.1 10*3/uL — ABNORMAL HIGH (ref 0.1–1.0)
Monocytes Relative: 8 %
Neutro Abs: 9.7 10*3/uL — ABNORMAL HIGH (ref 1.7–7.7)
Neutrophils Relative %: 71 %
Platelets: 262 10*3/uL (ref 150–400)
RBC: 4.91 MIL/uL (ref 3.87–5.11)
RDW: 14.4 % (ref 11.5–15.5)
WBC: 13.4 10*3/uL — ABNORMAL HIGH (ref 4.0–10.5)
nRBC: 0 % (ref 0.0–0.2)

## 2021-05-04 LAB — CBG MONITORING, ED: Glucose-Capillary: 129 mg/dL — ABNORMAL HIGH (ref 70–99)

## 2021-05-04 LAB — ETHANOL: Alcohol, Ethyl (B): <10 mg/dL (ref <10)

## 2021-05-04 MED ORDER — ONDANSETRON HCL 4 MG PO TABS: 4.0000 mg | ORAL_TABLET | Freq: Four times a day (QID) | ORAL | Status: DC | PRN

## 2021-05-04 MED ORDER — ENOXAPARIN SODIUM 40 MG/0.4ML IJ SOSY
40.0000 mg | PREFILLED_SYRINGE | INTRAMUSCULAR | Status: DC
  Administered 2021-05-05: 40 mg via SUBCUTANEOUS
  Filled 2021-05-04: qty 0.4

## 2021-05-04 MED ORDER — LACTATED RINGERS IV SOLN: INTRAVENOUS | Status: DC

## 2021-05-04 MED ORDER — GABAPENTIN 300 MG PO CAPS
300.0000 mg | ORAL_CAPSULE | Freq: Three times a day (TID) | ORAL | Status: DC
  Administered 2021-05-05: 300 mg via ORAL
  Filled 2021-05-04: qty 1

## 2021-05-04 MED ORDER — ROSUVASTATIN CALCIUM 20 MG PO TABS
20.0000 mg | ORAL_TABLET | Freq: Every evening | ORAL | Status: DC
  Administered 2021-05-05: 20 mg via ORAL
  Filled 2021-05-04: qty 1

## 2021-05-04 MED ORDER — CEPHALEXIN 500 MG PO CAPS
1000.0000 mg | ORAL_CAPSULE | Freq: Once | ORAL | Status: AC
  Administered 2021-05-04: 1000 mg via ORAL
  Filled 2021-05-04: qty 2

## 2021-05-04 MED ORDER — LORAZEPAM 2 MG/ML IJ SOLN
1.0000 mg | Freq: Four times a day (QID) | INTRAMUSCULAR | Status: DC | PRN
  Administered 2021-05-05: 1 mg via INTRAVENOUS
  Filled 2021-05-04: qty 1

## 2021-05-04 MED ORDER — FENOFIBRATE 160 MG PO TABS
160.0000 mg | ORAL_TABLET | Freq: Every day | ORAL | Status: DC
  Administered 2021-05-05 – 2021-05-06 (×2): 160 mg via ORAL
  Filled 2021-05-04 (×2): qty 1

## 2021-05-04 MED ORDER — ONDANSETRON HCL 4 MG/2ML IJ SOLN: 4.0000 mg | Freq: Four times a day (QID) | INTRAMUSCULAR | Status: DC | PRN

## 2021-05-04 MED ORDER — LORAZEPAM 2 MG/ML IJ SOLN
1.0000 mg | Freq: Once | INTRAMUSCULAR | Status: AC
  Administered 2021-05-04: 1 mg via INTRAVENOUS
  Filled 2021-05-04: qty 1

## 2021-05-04 MED ORDER — MIRABEGRON ER 25 MG PO TB24
25.0000 mg | ORAL_TABLET | Freq: Every day | ORAL | Status: DC
  Administered 2021-05-05 – 2021-05-06 (×2): 25 mg via ORAL
  Filled 2021-05-04 (×2): qty 1

## 2021-05-04 MED ORDER — HALOPERIDOL LACTATE 5 MG/ML IJ SOLN
INTRAMUSCULAR | Status: AC
  Administered 2021-05-04: 10 mg via INTRAVENOUS
  Filled 2021-05-04: qty 2

## 2021-05-04 MED ORDER — SODIUM CHLORIDE 0.9 % IV SOLN
2.0000 g | INTRAVENOUS | Status: DC
  Administered 2021-05-04 – 2021-05-05 (×2): 2 g via INTRAVENOUS
  Filled 2021-05-04 (×2): qty 20

## 2021-05-04 MED ORDER — HALOPERIDOL LACTATE 5 MG/ML IJ SOLN: 10.0000 mg | Freq: Once | INTRAMUSCULAR | Status: AC

## 2021-05-04 NOTE — ED Triage Notes (Signed)
Altered mental status for the past 2 days, confused when answering questions

## 2021-05-04 NOTE — ED Notes (Signed)
Patient confused and removing leads.
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
## 2021-05-04 NOTE — H&P (Signed)
History and Physical  Rhonda Morales MVH:[id number] DOB: 1951-04-02 DOA: 05/04/2021  Referring physician: Dr Eulis Foster, ED physician PCP: Jani Gravel, MD  Outpatient Specialists:   Patient Coming From: home  Chief Complaint: Confusion, hallucinations  HPI: Rhonda Morales is a 70 y.o. female with a history of anxiety, chronic pain, hyperlipidemia, interstitial cystitis.  Patient seen for 3 days of worsening confusion with hallucinations.  Patient was brought in by family members.  Patient unable to give history and history is obtained by son sister.  Patient has been having worsening confusion with hallucinations to the point where she was walking down the road and talking to people who are not present in the room.  No palliating or provoking factors.  Patient does get frequent UTIs and has interstitial cystitis.  She has never had an episode of delirium due to the infection.  No palliating or provoking factors.  There have been no apparent fevers, chills.  Patient has been eating normally.  There have been no medication changes.  Emergency Department Course: Here in the emergency department, the patient is talking to people who are not present in the room.  She has been getting up and walking out to the nurses station.  White count slightly elevated.  UA suggestive of UTI.  Vitals normal.  Review of Systems:  Unable to provide  Past Medical History:  Diagnosis Date  . Anxiety   . Chronic pain   . Hyperlipidemia 05/04/2021  . Interstitial cystitis 05/04/2021   History reviewed. No pertinent surgical history. Social History:  reports that she has never smoked. She has never used smokeless tobacco. She reports that she does not use drugs. No history on file for alcohol use. Patient lives at home  Allergies  Allergen Reactions  . Hydrocodone Itching and Nausea Only  . Estradiol Other (See Comments)    Cream burned  . Morphine And Related     Unsure of type of reaction    No family history on  file.  Unable to provide  Prior to Admission medications   Medication Sig Start Date End Date Taking? Authorizing Provider  ALPRAZolam Duanne Moron) 0.5 MG tablet  01/14/20   [provider]  ALPRAZolam Duanne Moron) 1 MG tablet Take 1 mg by mouth 3 (three) times daily as needed.    [provider]  Biotin 10 MG TABS Take by mouth.    [provider]  fenofibrate micronized (LOFIBRA) 134 MG capsule Take 134 mg by mouth daily before breakfast.    [provider]  gabapentin (NEURONTIN) 300 MG capsule Take 300 mg by mouth 3 (three) times daily.    [provider]  mirabegron ER (MYRBETRIQ) 25 MG TB24 tablet Take 25 mg by mouth daily.    [provider]  Multiple Vitamin (MULTIVITAMIN) tablet Take 1 tablet by mouth daily.    [provider]  ondansetron (ZOFRAN) 4 MG tablet  12/16/19   [provider]  oxyCODONE-acetaminophen (PERCOCET) 7.5-325 MG tablet  01/14/20   [provider]  Pumpkin Seed-Soy Germ (AZO BLADDER CONTROL/GO-LESS PO) Take by mouth.    [provider]  rosuvastatin (CRESTOR) 20 MG tablet  11/01/19   [provider]  Vitamin D, Ergocalciferol, (DRISDOL) 1.25 MG (50000 UNIT) CAPS capsule  09/24/19   [provider]    Physical Exam: BP 136/82   Pulse 90   Temp 99.1 F (37.3 C) (Oral)   Resp 18   SpO2 94%   . General: Elderly female.  Awake and alert and oriented x2 (person and date). No acute cardiopulmonary distress.  Marland Kitchen HEENT: Normocephalic atraumatic.  Right and left ears normal in appearance.  Pupils equal, round, reactive to light. Extraocular muscles are intact. Sclerae anicteric and noninjected.  Moist mucosal membranes. No mucosal lesions.  . Neck: Neck supple without lymphadenopathy. No carotid bruits. No masses palpated.  . Cardiovascular: Regular rate with normal S1-S2 sounds. No murmurs, rubs, gallops auscultated. No JVD.  Marland Kitchen Respiratory: Good respiratory effort with no  wheezes, rales, rhonchi. Lungs clear to auscultation bilaterally.  No accessory muscle use. . Abdomen: Soft, nontender, nondistended. Suprapubic tenderness. Active bowel sounds. No masses or hepatosplenomegaly  . Skin: No rashes, lesions, or ulcerations.  Dry, warm to touch. 2+ dorsalis pedis and radial pulses. . Musculoskeletal: No calf or leg pain. All major joints not erythematous nontender.  No upper or lower joint deformation.  Good ROM.  No contractures  . Psychiatric: Lacks insite and judgement.  . Neurologic: No focal neurological deficits. Strength is 5/5 and symmetric in upper and lower extremities.  Cranial nerves II through XII are grossly intact.           Labs on Admission: I have personally reviewed following labs and imaging studies  CBC: Recent Labs  Lab 05/04/21 1600  WBC 13.4*  NEUTROABS 9.7*  HGB 13.6  HCT 42.2  MCV 85.9  PLT 488   Basic Metabolic Panel: Recent Labs  Lab 05/04/21 1600  NA 136  K 3.7  CL 103  CO2 24  GLUCOSE 148*  BUN 19  CREATININE 0.79  CALCIUM 9.6   GFR: CrCl cannot be calculated (Unknown ideal weight.). Liver Function Tests: Recent Labs  Lab 05/04/21 1600  AST 19  ALT 34  ALKPHOS 80  BILITOT 0.7  PROT 7.9  ALBUMIN 4.2   No results for input(s): LIPASE, AMYLASE in the last 168 hours. No results for input(s): AMMONIA in the last 168 hours. Coagulation Profile: No results for input(s): INR, PROTIME in the last 168 hours. Cardiac Enzymes: No results for input(s): CKTOTAL, CKMB, CKMBINDEX, TROPONINI in the last 168 hours. BNP (last 3 results) No results for input(s): PROBNP in the last 8760 hours. HbA1C: No results for input(s): HGBA1C in the last 72 hours. CBG: Recent Labs  Lab 05/04/21 1509  GLUCAP 129*   Lipid Profile: No results for input(s): CHOL, HDL, LDLCALC, TRIG, CHOLHDL, LDLDIRECT in the last 72 hours. Thyroid Function Tests: No results for input(s): TSH, T4TOTAL, FREET4, T3FREE, THYROIDAB in the last 72  hours. Anemia Panel: No results for input(s): VITAMINB12, FOLATE, FERRITIN, TIBC, IRON, RETICCTPCT in the last 72 hours. Urine analysis:    Component Value Date/Time   COLORURINE YELLOW 05/04/2021 1706   APPEARANCEUR HAZY (A) 05/04/2021 1706   APPEARANCEUR Clear 08/09/2020 1646   LABSPEC 1.023 05/04/2021 1706   PHURINE 5.0 05/04/2021 1706   GLUCOSEU NEGATIVE 05/04/2021 1706   HGBUR SMALL (A) 05/04/2021 1706   BILIRUBINUR NEGATIVE 05/04/2021 1706   BILIRUBINUR Negative 08/09/2020 1646   KETONESUR NEGATIVE 05/04/2021 1706   PROTEINUR NEGATIVE 05/04/2021 1706   UROBILINOGEN 0.2 04/15/2008 1444   NITRITE NEGATIVE 05/04/2021 1706   LEUKOCYTESUR MODERATE (A) 05/04/2021 1706   Sepsis Labs: @LABRCNTIP (procalcitonin:4,lacticidven:4) )No results found for this or any previous visit (from the past 240 hour(s)).   Radiological Exams on Admission: CT Head Wo Contrast  Result Date: 05/04/2021 CLINICAL DATA:  Altered mental status. EXAM: CT HEAD WITHOUT CONTRAST TECHNIQUE: Contiguous axial images were obtained from the base of  the skull through the vertex without intravenous contrast. COMPARISON:  March 25, 2012 FINDINGS: Brain: No evidence of acute infarction, hemorrhage, hydrocephalus, extra-axial collection or mass lesion/mass effect. Mild, stable left basal ganglia calcification is noted. Vascular: No hyperdense vessels are identified. Skull: Normal. Negative for fracture or focal lesion. Sinuses/Orbits: No acute finding. Other: None. IMPRESSION: No acute intracranial abnormality. Electronically Signed   By: Virgina Norfolk M.D.   On: 05/04/2021 16:24    Assessment/Plan: Principal Problem:   Delirium Active Problems:   Interstitial cystitis   Hyperlipidemia   Anxiety   Acute lower UTI   Chronic pain    This patient was discussed with the ED physician, including pertinent vitals, physical exam findings, labs, and imaging.  We also discussed care given by the ED  provider.  1. Delirium a. Admit b. Probably secondary to UTI, however will check TSH, vitamin B12. c. Patient did receive Haldol due to delirium -we will try to keep this at a minimum 2. Acute UTI a. Rocephin 2 g every 24 hours b. Urine cultures c. IV fluids d. Check CBC in the morning 3. Chronic pain a. Patient not exhibiting pain at this moment b. Will hold sedating and altering medications. 4. Hyperlipidemia a. Continue statin 5. Anxiety  a. Ativan as needed  DVT prophylaxis: Lovenox Consultants: None Code Status: Family unsure of CODE STATUS.  We will presume full code at this moment and change in the deemed necessary by family Family Communication: Discussed patient with sister patient has been Disposition Plan: Patient should be able to return home following improvement of delirium   Truett Mainland, DO

## 2021-05-04 NOTE — ED Provider Notes (Signed)
[facility] EMERGENCY DEPARTMENT Provider Note   CSN: [id number] Arrival date & time: 05/04/21  1412     History Chief Complaint  Patient presents with  . Altered Mental Status    Rhonda Morales is a 70 y.o. female.  HPI Patient brought in by her son, for evaluation of confusion that started 2 days ago.  There have been no known trauma.  There are no known illnesses.  Patient son has not seen the patient get like this before.  Apparently one of her children has a similar problem.  Patient son had to convince the patient to come here by telling her that he was ill.  He is in the room with her.  Level 5 caveat    History reviewed. No pertinent past medical history.  There are no problems to display for this patient.   History reviewed. No pertinent surgical history.   OB History   No obstetric history on file.     No family history on file.  Social History   Tobacco Use  . Smoking status: Never Smoker  . Smokeless tobacco: Never Used  Substance Use Topics  . Drug use: Never    Home Medications Prior to Admission medications   Medication Sig Start Date End Date Taking? Authorizing Provider  ALPRAZolam Duanne Moron) 0.5 MG tablet  01/14/20   [provider]  ALPRAZolam Duanne Moron) 1 MG tablet Take 1 mg by mouth 3 (three) times daily as needed.    [provider]  Biotin 10 MG TABS Take by mouth.    [provider]  fenofibrate micronized (LOFIBRA) 134 MG capsule Take 134 mg by mouth daily before breakfast.    [provider]  gabapentin (NEURONTIN) 300 MG capsule Take 300 mg by mouth 3 (three) times daily.    [provider]  mirabegron ER (MYRBETRIQ) 25 MG TB24 tablet Take 25 mg by mouth daily.    [provider]  Multiple Vitamin (MULTIVITAMIN) tablet Take 1 tablet by mouth daily.    [provider]  ondansetron (ZOFRAN) 4 MG tablet  12/16/19   [provider]  oxyCODONE-acetaminophen (PERCOCET) 7.5-325  MG tablet  01/14/20   [provider]  Pumpkin Seed-Soy Germ (AZO BLADDER CONTROL/GO-LESS PO) Take by mouth.    [provider]  rosuvastatin (CRESTOR) 20 MG tablet  11/01/19   [provider]  Vitamin D, Ergocalciferol, (DRISDOL) 1.25 MG (50000 UNIT) CAPS capsule  09/24/19   [provider]    Allergies    Hydrocodone, Estradiol, and Morphine and related  Review of Systems   Review of Systems  Unable to perform ROS: Mental status change    Physical Exam Updated Vital Signs BP 136/82   Pulse 90   Temp 99.1 F (37.3 C) (Oral)   Resp 18   SpO2 94%   Physical Exam Vitals and nursing note reviewed.  Constitutional:      General: She is not in acute distress.    Appearance: She is well-developed. She is not ill-appearing, toxic-appearing or diaphoretic.  HENT:     Head: Normocephalic and atraumatic.     Right Ear: External ear normal.     Left Ear: External ear normal.     Nose: No congestion or rhinorrhea.     Mouth/Throat:     Pharynx: No oropharyngeal exudate or posterior oropharyngeal erythema.  Eyes:     Conjunctiva/sclera: Conjunctivae normal.     Pupils: Pupils are equal, round, and reactive to light.  Neck:     Trachea: Phonation normal.  Cardiovascular:     Rate and Rhythm: Normal rate and regular rhythm.     Heart sounds: Normal heart sounds.  Pulmonary:     Effort: Pulmonary effort is normal.     Breath sounds: Normal breath sounds.  Abdominal:     Palpations: Abdomen is soft.     Tenderness: There is no abdominal tenderness.  Musculoskeletal:        General: Normal range of motion.     Cervical back: Normal range of motion and neck supple.  Skin:    General: Skin is warm and dry.  Neurological:     Mental Status: She is alert and oriented to person, place, and time.     Cranial Nerves: No cranial nerve deficit.     Sensory: No sensory deficit.     Motor: No abnormal muscle tone.     Coordination: Coordination normal.   Psychiatric:        Attention and Perception: She is inattentive. She does not perceive auditory or visual hallucinations.        Mood and Affect: Mood is anxious. Affect is inappropriate.        Speech: She is communicative. Speech is tangential.        Behavior: Behavior is cooperative.        Thought Content: Thought content is delusional. Thought content is not paranoid. Thought content does not include suicidal ideation.        Cognition and Memory: Cognition is impaired. She exhibits impaired recent memory.        Judgment: Judgment is inappropriate.     Comments: Patient responds to questions with random responses such as, when asked where she is, patient states "do you shop at this type of store."     ED Results / Procedures / Treatments   Labs (all labs ordered are listed, but only abnormal results are displayed) Labs Reviewed  COMPREHENSIVE METABOLIC PANEL - Abnormal; Notable for the following components:      Result Value   Glucose, Bld 148 (*)    All other components within normal limits  CBC WITH DIFFERENTIAL/PLATELET - Abnormal; Notable for the following components:   WBC 13.4 (*)    Neutro Abs 9.7 (*)    Monocytes Absolute 1.1 (*)    Abs Immature Granulocytes 0.09 (*)    All other components within normal limits  URINALYSIS, ROUTINE W REFLEX MICROSCOPIC - Abnormal; Notable for the following components:   APPearance HAZY (*)    Hgb urine dipstick SMALL (*)    Leukocytes,Ua MODERATE (*)    WBC, UA >50 (*)    Bacteria, UA MANY (*)    All other components within normal limits  CBG MONITORING, ED - Abnormal; Notable for the following components:   Glucose-Capillary 129 (*)    All other components within normal limits  URINE CULTURE  SARS CORONAVIRUS 2 (TAT 6-24 HRS)  ETHANOL  RAPID URINE DRUG SCREEN, HOSP PERFORMED    EKG None  Radiology CT Head Wo Contrast  Result Date: 05/04/2021 CLINICAL DATA:  Altered mental status. EXAM: CT HEAD WITHOUT CONTRAST  TECHNIQUE: Contiguous axial images were obtained from the base of the skull through the vertex without intravenous contrast. COMPARISON:  March 25, 2012 FINDINGS: Brain: No evidence of acute infarction, hemorrhage, hydrocephalus, extra-axial collection or mass lesion/mass effect. Mild, stable left basal ganglia calcification is noted. Vascular: No hyperdense vessels are identified. Skull: Normal. Negative for fracture or focal  lesion. Sinuses/Orbits: No acute finding. Other: None. IMPRESSION: No acute intracranial abnormality. Electronically Signed   By: Virgina Norfolk M.D.   On: 05/04/2021 16:24    Procedures .Critical Care Performed by: Daleen Bo, MD Authorized by: Daleen Bo, MD   Critical care provider statement:    Critical care time (minutes):  35   Critical care start time:  05/04/2021 3:10 PM   Critical care end time:  05/04/2021 7:19 PM   Critical care time was exclusive of:  Separately billable procedures and treating other patients   Critical care was necessary to treat or prevent imminent or life-threatening deterioration of the following conditions:  CNS failure or compromise   Critical care was time spent personally by me on the following activities:  Blood draw for specimens, development of treatment plan with patient or surrogate, discussions with consultants, evaluation of patient's response to treatment, examination of patient, obtaining history from patient or surrogate, ordering and performing treatments and interventions, ordering and review of laboratory studies, pulse oximetry, re-evaluation of patient's condition, review of old charts and ordering and review of radiographic studies     Medications Ordered in ED Medications  cephALEXin (KEFLEX) capsule 1,000 mg (1,000 mg Oral Given 05/04/21 1830)    ED Course  I have reviewed the triage vital signs and the nursing notes.  Pertinent labs & imaging results that were available during my care of the patient were  reviewed by me and considered in my medical decision making (see chart for details).  Clinical Course as of 05/04/21 1919  Fri May 04, 2021  1852 Patient is now here with her sister.  Her sister states that "she does not recognize me."  Her sister last saw her about 3 weeks ago and talk to her on the phone about 2 weeks ago and at that time she was normal.  Patient's sister was able to give me additional history not obtained from the patient's son.  The patient lives with her husband who has MS but is able to care for himself.  The patient also lives with her middle-aged adult son, who has brain damage from "too much drugs." [EW]    Clinical Course User Index [EW] Daleen Bo, MD   MDM Rules/Calculators/A&P                           Patient Vitals for the past 24 hrs:  BP Temp Temp src Pulse Resp SpO2  05/04/21 1820 136/82 -- -- 90 18 94 %  05/04/21 1630 (!) 129/50 -- -- 86 18 96 %  05/04/21 1600 136/86 -- -- -- (!) 22 --  05/04/21 1540 (!) 113/37 -- -- 91 (!) 25 92 %  05/04/21 1502 136/78 99.1 F (37.3 C) Oral 96 18 94 %    7:14 PM Reevaluation with update and discussion. After initial assessment and treatment, an updated evaluation reveals she continues to be altered with a clinical presentation consistent with delirium.  Findings discussed with the patient's sister who was in the room.Daleen Bo   Medical Decision Making:  This patient is presenting for evaluation of altered mental status, which does require a range of treatment options, and is a complaint that involves a high risk of morbidity and mortality. The differential diagnoses include acute CNS disorder, metabolic disorder, intoxicated, psychiatric disorder. I decided to review old records, and in summary elderly female presenting with nonspecific symptoms, for short duration, 48 hours.  No known trauma.Marland Kitchen  I obtained additional historical information from patient's son and her sister.  Clinical Laboratory Tests  Ordered, included CBC, Metabolic panel and Urinalysis. Review indicates normal except glucose high, white count high, urinalysis is abnormal.  Urine culture sent.  Added on labs, alcohol level and urine drug screen. Radiologic Tests Ordered, included CT head.  I independently Visualized: Radiograph images, which show no acute abnormalities  Critical Interventions-clinical evaluation, laboratory testing, radiography, observation and reassessment.  Treatment with Keflex for UTI.  After These Interventions, the Patient was reevaluated and was found with persistent signs and symptoms of delirium.  Patient does have urinary tract infection however her degree of delirium and short onset, are not consistent with an uncomplicated UTI which I believe she has.  Patient will require inpatient hospitalization for monitoring and stabilization.  She will likely require psychiatric evaluation as well.  I believe that she is under significant stress because of issues at home with family members.  CRITICAL CARE-yes Performed by: Daleen Bo  Nursing Notes Reviewed/ Care Coordinated Applicable Imaging Reviewed Interpretation of Laboratory Data incorporated into ED treatment   7:18 PM-Consult complete with hospitalist. Patient case explained and discussed.  He agrees to admit patient for further evaluation and treatment. Call ended at 7:25 PM    Final Clinical Impression(s) / ED Diagnoses Final diagnoses:  Delirium  Urinary tract infection without hematuria, site unspecified    Rx / DC Orders ED Discharge Orders    None       Daleen Bo, MD 05/04/21 1949

## 2021-05-04 NOTE — ED Notes (Signed)
Pt made aware that we need a urine specimen for testing.

## 2021-05-04 NOTE — ED Notes (Signed)
Hospitalist at bedside 

## 2021-05-04 NOTE — ED Notes (Signed)
Pt is removing all monitoring equipment. Patient's son at bedside.

## 2021-05-05 ENCOUNTER — Other Ambulatory Visit: Payer: Self-pay

## 2021-05-05 LAB — CBC
HCT: 38.8 % (ref 36.0–46.0)
Hemoglobin: 12.3 g/dL (ref 12.0–15.0)
MCH: 27.6 pg (ref 26.0–34.0)
MCHC: 31.7 g/dL (ref 30.0–36.0)
MCV: 87.2 fL (ref 80.0–100.0)
Platelets: 223 10*3/uL (ref 150–400)
RBC: 4.45 MIL/uL (ref 3.87–5.11)
RDW: 14.3 % (ref 11.5–15.5)
WBC: 9.4 10*3/uL (ref 4.0–10.5)
nRBC: 0 % (ref 0.0–0.2)

## 2021-05-05 LAB — BASIC METABOLIC PANEL
Anion gap: 9 (ref 5–15)
BUN: 20 mg/dL (ref 8–23)
CO2: 26 mmol/L (ref 22–32)
Calcium: 9.1 mg/dL (ref 8.9–10.3)
Chloride: 103 mmol/L (ref 98–111)
Creatinine, Ser: 0.66 mg/dL (ref 0.44–1.00)
GFR, Estimated: >60 mL/min (ref >60)
Glucose, Bld: 105 mg/dL — ABNORMAL HIGH (ref 70–99)
Potassium: 3.6 mmol/L (ref 3.5–5.1)
Sodium: 138 mmol/L (ref 135–145)

## 2021-05-05 LAB — TSH: TSH: 1.893 u[IU]/mL (ref 0.350–4.500)

## 2021-05-05 LAB — VITAMIN B12: Vitamin B-12: 265 pg/mL (ref 180–914)

## 2021-05-05 LAB — SARS CORONAVIRUS 2 (TAT 6-24 HRS): SARS Coronavirus 2: NEGATIVE

## 2021-05-05 LAB — HIV ANTIBODY (ROUTINE TESTING W REFLEX): HIV Screen 4th Generation wRfx: NONREACTIVE

## 2021-05-05 MED ORDER — GABAPENTIN 100 MG PO CAPS
100.0000 mg | ORAL_CAPSULE | Freq: Three times a day (TID) | ORAL | Status: DC
  Administered 2021-05-05 – 2021-05-06 (×3): 100 mg via ORAL
  Filled 2021-05-05 (×3): qty 1

## 2021-05-05 MED ORDER — CYANOCOBALAMIN 1000 MCG/ML IJ SOLN
1000.0000 ug | Freq: Once | INTRAMUSCULAR | Status: AC
  Administered 2021-05-05: 1000 ug via INTRAMUSCULAR
  Filled 2021-05-05: qty 1

## 2021-05-05 NOTE — Progress Notes (Signed)
PROGRESS NOTE    Rhonda Morales  VQQ:[id number] DOB: 1951-01-25 DOA: 05/04/2021 PCP: Jani Gravel, MD    Brief Narrative:  70 year old female with a history of chronic pain, anxiety, is brought to the hospital with a 3-day history of worsening confusion and hallucinations.  Found to have possible UTI.  Also concerns for polypharmacy.   Assessment & Plan:   Principal Problem:   Delirium Active Problems:   Interstitial cystitis   Hyperlipidemia   Anxiety   Acute lower UTI   Chronic pain   Acute metabolic encephalopathy Delirium -Patient was admitted with confusion and hallucinations -Possibly related to urinary tract infection -Polypharmacy may also be playing a role since she is on multiple sedative medications and was recently started on cyclobenzaprine -CT head negative -Holding sedating medications for now until pharmacy can reconcile meds -Overall mental status appears to be improving, though she is still mildly confused about her medications  Urinary tract infection -Urinalysis indicates possible infection -Prior urine cultures to indicate history of antibiotic resistant infections -Continue on ceftriaxone for now and follow-up urine culture  Hyperlipidemia -Continue statin  Chronic pain -Appears the last time that she received Percocet was 09/2020 -Unclear if she is receiving from a different pharmacy at this point -She will need to follow-up with her pain specialist  Low vitamin B12 -Currently, B12 level at lower range of normal -We will give a supplemental dose  Anxiety -She is chronically on Xanax, but her pharmacy reports this was last filled in 10/2020 -Urine drug screen is positive for benzodiazepines -We will wait medical reconciliation done by pharmacy   DVT prophylaxis: enoxaparin (LOVENOX) injection 40 mg Start: 05/04/21 2145  Code Status: full code Family Communication: discussed with son over the phone Disposition Plan: Status is:  Inpatient  Remains inpatient appropriate because:Altered mental status and Inpatient level of care appropriate due to severity of illness   Dispo: The patient is from: Home              Anticipated d/c is to: Home              Patient currently is not medically stable to d/c.   Difficult to place patient No         Consultants:     Procedures:     Antimicrobials:   Ceftriaxone 6/3>    Subjective: Patient is more awake. She does complain of headache an occasional double vision. She does acknowledge that she was having visual hallucinations. She is still foggy on her medications.   Objective: Vitals:   05/04/21 1820 05/04/21 2049 05/04/21 2054 05/05/21 0511  BP: 136/82  119/64 99/61  Pulse: 90  84 81  Resp: 18  18 16   Temp:   97.9 F (36.6 C) 98.1 F (36.7 C)  TempSrc:   Oral Oral  SpO2: 94%  96% 97%  Weight:  68.1 kg    Height:  5\' 2"  (1.575 m)      Intake/Output Summary (Last 24 hours) at 05/05/2021 1040 Last data filed at 05/05/2021 0830 Gross per 24 hour  Intake 460 ml  Output 13 ml  Net 447 ml   Filed Weights   05/04/21 2049  Weight: 68.1 kg    Examination:  General exam: Appears calm and comfortable  Respiratory system: Clear to auscultation. Respiratory effort normal. Cardiovascular system: S1 & S2 heard, RRR. No JVD, murmurs, rubs, gallops or clicks. No pedal edema. Gastrointestinal system: Abdomen is nondistended, soft and nontender. No organomegaly or masses felt.  Normal bowel sounds heard. Central nervous system: Alert and oriented. No focal neurological deficits. Extremities: Symmetric 5 x 5 power. Skin: No rashes, lesions or ulcers Psychiatry: Judgement and insight appear normal. Mood & affect appropriate.     Data Reviewed: I have personally reviewed following labs and imaging studies  CBC: Recent Labs  Lab 05/04/21 1600 05/05/21 0432  WBC 13.4* 9.4  NEUTROABS 9.7*  --   HGB 13.6 12.3  HCT 42.2 38.8  MCV 85.9 87.2  PLT 262  537   Basic Metabolic Panel: Recent Labs  Lab 05/04/21 1600 05/05/21 0432  NA 136 138  K 3.7 3.6  CL 103 103  CO2 24 26  GLUCOSE 148* 105*  BUN 19 20  CREATININE 0.79 0.66  CALCIUM 9.6 9.1   GFR: Estimated Creatinine Clearance: 60.9 mL/min (by C-G formula based on SCr of 0.66 mg/dL). Liver Function Tests: Recent Labs  Lab 05/04/21 1600  AST 19  ALT 34  ALKPHOS 80  BILITOT 0.7  PROT 7.9  ALBUMIN 4.2   No results for input(s): LIPASE, AMYLASE in the last 168 hours. No results for input(s): AMMONIA in the last 168 hours. Coagulation Profile: No results for input(s): INR, PROTIME in the last 168 hours. Cardiac Enzymes: No results for input(s): CKTOTAL, CKMB, CKMBINDEX, TROPONINI in the last 168 hours. BNP (last 3 results) No results for input(s): PROBNP in the last 8760 hours. HbA1C: No results for input(s): HGBA1C in the last 72 hours. CBG: Recent Labs  Lab 05/04/21 1509  GLUCAP 129*   Lipid Profile: No results for input(s): CHOL, HDL, LDLCALC, TRIG, CHOLHDL, LDLDIRECT in the last 72 hours. Thyroid Function Tests: Recent Labs    05/05/21 0432  TSH 1.893   Anemia Panel: Recent Labs    05/05/21 0433  VITAMINB12 265   Sepsis Labs: No results for input(s): PROCALCITON, LATICACIDVEN in the last 168 hours.  No results found for this or any previous visit (from the past 240 hour(s)).       Radiology Studies: CT Head Wo Contrast  Result Date: 05/04/2021 CLINICAL DATA:  Altered mental status. EXAM: CT HEAD WITHOUT CONTRAST TECHNIQUE: Contiguous axial images were obtained from the base of the skull through the vertex without intravenous contrast. COMPARISON:  March 25, 2012 FINDINGS: Brain: No evidence of acute infarction, hemorrhage, hydrocephalus, extra-axial collection or mass lesion/mass effect. Mild, stable left basal ganglia calcification is noted. Vascular: No hyperdense vessels are identified. Skull: Normal. Negative for fracture or focal lesion.  Sinuses/Orbits: No acute finding. Other: None. IMPRESSION: No acute intracranial abnormality. Electronically Signed   By: Virgina Norfolk M.D.   On: 05/04/2021 16:24        Scheduled Meds: . enoxaparin (LOVENOX) injection  40 mg Subcutaneous Q24H  . fenofibrate  160 mg Oral Daily  . mirabegron ER  25 mg Oral Daily  . rosuvastatin  20 mg Oral QPM   Continuous Infusions: . cefTRIAXone (ROCEPHIN)  IV Stopped (05/04/21 2027)  . lactated ringers 100 mL/hr at 05/05/21 1039     LOS: 1 day    Time spent: 35mins    Kathie Dike, MD [facility]   If 7PM-7AM, please contact night-coverage [email or web address]  05/05/2021, 10:40 AM

## 2021-05-05 NOTE — Progress Notes (Signed)
Has been alert and oriented all day.  Watching tv and ambulating independently to bathroom

## 2021-05-06 MED ORDER — VITAMIN B-12 1000 MCG PO TABS: 1000.0000 ug | ORAL_TABLET | Freq: Every day | ORAL | 1 refills | Status: DC

## 2021-05-06 MED ORDER — CEFDINIR 300 MG PO CAPS: 300.0000 mg | ORAL_CAPSULE | Freq: Two times a day (BID) | ORAL | 0 refills | Status: DC

## 2021-05-06 NOTE — Discharge Summary (Signed)
Physician Discharge Summary  Rhonda Morales NGE:[id number] DOB: 02-25-1951 DOA: 05/04/2021  PCP: Jani Gravel, MD  Admit date: 05/04/2021 Discharge date: 05/06/2021  Admitted From: Home Disposition: Home  Recommendations for Outpatient Follow-up:  1. Follow up with PCP in 1-2 weeks 2. Please obtain BMP/CBC in one week  Home Health: Equipment/Devices:  Discharge Condition: Stable CODE STATUS: Full code Diet recommendation: Heart healthy  Brief/Interim Summary: 69 year old female with a history of chronic pain, anxiety, is brought to the hospital with a 3-day history of worsening confusion and hallucinations.  Found to have possible UTI.  Also concerns for polypharmacy.  Discharge Diagnoses:  Principal Problem:   Delirium Active Problems:   Interstitial cystitis   Hyperlipidemia   Anxiety   Acute lower UTI   Chronic pain  Acute metabolic encephalopathy Delirium -Patient was admitted with confusion and hallucinations -Possibly related to urinary tract infection -Polypharmacy may also be playing a role since she is on multiple sedative medications and was recently started on cyclobenzaprine -CT head negative -Holding sedating medications for now until pharmacy can reconcile meds -Overall mental status back to baseline -Recommended to stop cyclobenzaprine  Urinary tract infection -Urinalysis indicates possible infection -Urine culture growing 50,000 colonies of gram-negative rods -She has been receiving ceftriaxone and appears to be clinically improving, will transition to [facility] to complete her course  Hyperlipidemia -Continue statin  Chronic pain -She was previously on Percocet when she is following with a pain clinic, but reports she has been off of this for quite some time now  Low vitamin B12 -Currently, B12 level at lower range of normal -We will give a supplemental dose  Anxiety -Reports that she was previously on Xanax, but has been off of this for quite  some time  Discharge Instructions  Discharge Instructions    Diet - low sodium heart healthy   Complete by: As directed    Increase activity slowly   Complete by: As directed      Allergies as of 05/06/2021      Reactions   Hydrocodone Itching, Nausea Only   Estradiol Other (See Comments)   Cream burned   Morphine And Related    Unsure of type of reaction      Medication List    STOP taking these medications   ALPRAZolam 0.5 MG tablet Commonly known as: XANAX   ALPRAZolam 1 MG tablet Commonly known as: XANAX   atenolol 25 MG tablet Commonly known as: TENORMIN   cyclobenzaprine 10 MG tablet Commonly known as: FLEXERIL   fenofibrate micronized 134 MG capsule Commonly known as: LOFIBRA   mirabegron ER 25 MG Tb24 tablet Commonly known as: MYRBETRIQ   ondansetron 4 MG tablet Commonly known as: ZOFRAN   oxyCODONE-acetaminophen 7.5-325 MG tablet Commonly known as: PERCOCET   rosuvastatin 20 MG tablet Commonly known as: CRESTOR     TAKE these medications   AZO BLADDER CONTROL/GO-LESS PO Take by mouth.   Biotin 10 MG Tabs Take by mouth.   cefdinir 300 MG capsule Commonly known as: OMNICEF Take 1 capsule (300 mg total) by mouth 2 (two) times daily.   gabapentin 300 MG capsule Commonly known as: NEURONTIN Take 300 mg by mouth 3 (three) times daily.   mirtazapine 30 MG tablet Commonly known as: REMERON Take 30 mg by mouth at bedtime.   multivitamin tablet Take 1 tablet by mouth daily.   vitamin B-12 1000 MCG tablet Commonly known as: CYANOCOBALAMIN Take 1 tablet (1,000 mcg total) by mouth daily.  Vitamin D (Ergocalciferol) 1.25 MG (50000 UNIT) Caps capsule Commonly known as: DRISDOL       Allergies  Allergen Reactions  . Hydrocodone Itching and Nausea Only  . Estradiol Other (See Comments)    Cream burned  . Morphine And Related     Unsure of type of reaction    Consultations:     Procedures/Studies: CT Head Wo Contrast  Result  Date: 05/04/2021 CLINICAL DATA:  Altered mental status. EXAM: CT HEAD WITHOUT CONTRAST TECHNIQUE: Contiguous axial images were obtained from the base of the skull through the vertex without intravenous contrast. COMPARISON:  March 25, 2012 FINDINGS: Brain: No evidence of acute infarction, hemorrhage, hydrocephalus, extra-axial collection or mass lesion/mass effect. Mild, stable left basal ganglia calcification is noted. Vascular: No hyperdense vessels are identified. Skull: Normal. Negative for fracture or focal lesion. Sinuses/Orbits: No acute finding. Other: None. IMPRESSION: No acute intracranial abnormality. Electronically Signed   By: Virgina Norfolk M.D.   On: 05/04/2021 16:24       Subjective: Feeling better, no confusion, alert and oriented, feels well  Discharge Exam: Vitals:   05/05/21 0511 05/05/21 1454 05/05/21 2145 05/06/21 0500  BP: 99/61 (!) 108/50 110/64 108/66  Pulse: 81 91 89 82  Resp: 16     Temp: 98.1 F (36.7 C) 98.2 F (36.8 C) 98.4 F (36.9 C) 98.9 F (37.2 C)  TempSrc: Oral Oral Oral   SpO2: 97% 95% 97%   Weight:      Height:        General: Pt is alert, awake, not in acute distress Cardiovascular: RRR, S1/S2 +, no rubs, no gallops Respiratory: CTA bilaterally, no wheezing, no rhonchi Abdominal: Soft, NT, ND, bowel sounds + Extremities: no edema, no cyanosis    The results of significant diagnostics from this hospitalization (including imaging, microbiology, ancillary and laboratory) are listed below for reference.     Microbiology: Recent Results (from the past 240 hour(s))  Urine culture     Status: Abnormal (Preliminary result)   Collection Time: 05/04/21  5:06 PM   Specimen: Urine, Clean Catch  Result Value Ref Range Status   Specimen Description   Final    URINE, CLEAN CATCH Performed at [facility], [address] [place], [place] [address]    Special Requests   Final    NONE Performed at [facility], [address] [place],  [place] [address]    Culture (A)  Final    50,000 COLONIES/mL GRAM NEGATIVE RODS SUSCEPTIBILITIES TO FOLLOW Performed at [facility], [place] [address] [place], [place] [address]    Report Status PENDING  Incomplete  SARS CORONAVIRUS 2 (TAT 6-24 HRS) Nasopharyngeal Nasopharyngeal Swab     Status: None   Collection Time: 05/04/21  6:59 PM   Specimen: Nasopharyngeal Swab  Result Value Ref Range Status   SARS Coronavirus 2 NEGATIVE NEGATIVE Final    Comment: (NOTE) SARS-CoV-2 target nucleic acids are NOT DETECTED.  The SARS-CoV-2 RNA is generally detectable in upper and lower respiratory specimens during the acute phase of infection. Negative results do not preclude SARS-CoV-2 infection, do not rule out co-infections with other pathogens, and should not be used as the sole basis for treatment or other patient management decisions. Negative results must be combined with clinical observations, patient history, and epidemiological information. The expected result is Negative.  Fact Sheet for Patients: SugarRoll.be  Fact Sheet for Healthcare Providers: https://www.woods-mathews.com/  This test is not yet approved or cleared by the [place]  FDA and  has been authorized for detection and/or diagnosis of SARS-CoV-2 by FDA under an Emergency Use Authorization (EUA). This EUA will remain  in effect (meaning this test can be used) for the duration of the COVID-19 declaration under Se ction 564(b)(1) of the Act, 21 U.S.C. section 360bbb-3(b)(1), unless the authorization is terminated or revoked sooner.  Performed at [facility], [place] [address], [place], [place] [address]      Labs: BNP (last 3 results) No results for input(s): BNP in the last 8760 hours. Basic Metabolic Panel: Recent Labs  Lab 05/04/21 1600 05/05/21 0432  NA 136 138  K 3.7 3.6  CL 103 103  CO2 24 26  GLUCOSE 148* 105*  BUN 19 20  CREATININE 0.79 0.66   CALCIUM 9.6 9.1   Liver Function Tests: Recent Labs  Lab 05/04/21 1600  AST 19  ALT 34  ALKPHOS 80  BILITOT 0.7  PROT 7.9  ALBUMIN 4.2   No results for input(s): LIPASE, AMYLASE in the last 168 hours. No results for input(s): AMMONIA in the last 168 hours. CBC: Recent Labs  Lab 05/04/21 1600 05/05/21 0432  WBC 13.4* 9.4  NEUTROABS 9.7*  --   HGB 13.6 12.3  HCT 42.2 38.8  MCV 85.9 87.2  PLT 262 223   Cardiac Enzymes: No results for input(s): CKTOTAL, CKMB, CKMBINDEX, TROPONINI in the last 168 hours. BNP: Invalid input(s): POCBNP CBG: Recent Labs  Lab 05/04/21 1509  GLUCAP 129*   D-Dimer No results for input(s): DDIMER in the last 72 hours. Hgb A1c No results for input(s): HGBA1C in the last 72 hours. Lipid Profile No results for input(s): CHOL, HDL, LDLCALC, TRIG, CHOLHDL, LDLDIRECT in the last 72 hours. Thyroid function studies Recent Labs    05/05/21 0432  TSH 1.893   Anemia work up Recent Labs    05/05/21 0433  VITAMINB12 265   Urinalysis    Component Value Date/Time   COLORURINE YELLOW 05/04/2021 1706   APPEARANCEUR HAZY (A) 05/04/2021 1706   APPEARANCEUR Clear 08/09/2020 1646   LABSPEC 1.023 05/04/2021 1706   PHURINE 5.0 05/04/2021 1706   GLUCOSEU NEGATIVE 05/04/2021 1706   HGBUR SMALL (A) 05/04/2021 1706   BILIRUBINUR NEGATIVE 05/04/2021 1706   BILIRUBINUR Negative 08/09/2020 1646   KETONESUR NEGATIVE 05/04/2021 1706   PROTEINUR NEGATIVE 05/04/2021 1706   UROBILINOGEN 0.2 04/15/2008 1444   NITRITE NEGATIVE 05/04/2021 1706   LEUKOCYTESUR MODERATE (A) 05/04/2021 1706   Sepsis Labs Invalid input(s): PROCALCITONIN,  WBC,  LACTICIDVEN Microbiology Recent Results (from the past 240 hour(s))  Urine culture     Status: Abnormal (Preliminary result)   Collection Time: 05/04/21  5:06 PM   Specimen: Urine, Clean Catch  Result Value Ref Range Status   Specimen Description   Final    URINE, CLEAN CATCH Performed at [facility], [address], [place], [place] [address]    Special Requests   Final    NONE Performed at [facility], [address], [place], [place] [address]    Culture (A)  Final    50,000 COLONIES/mL GRAM NEGATIVE RODS SUSCEPTIBILITIES TO FOLLOW Performed at [facility], [place] [address] [place], [place] [address]    Report Status PENDING  Incomplete  SARS CORONAVIRUS 2 (TAT 6-24 HRS) Nasopharyngeal Nasopharyngeal Swab     Status: None   Collection Time: 05/04/21  6:59 PM   Specimen: Nasopharyngeal Swab  Result Value Ref Range Status   SARS Coronavirus 2 NEGATIVE NEGATIVE  Final    Comment: (NOTE) SARS-CoV-2 target nucleic acids are NOT DETECTED.  The SARS-CoV-2 RNA is generally detectable in upper and lower respiratory specimens during the acute phase of infection. Negative results do not preclude SARS-CoV-2 infection, do not rule out co-infections with other pathogens, and should not be used as the sole basis for treatment or other patient management decisions. Negative results must be combined with clinical observations, patient history, and epidemiological information. The expected result is Negative.  Fact Sheet for Patients: SugarRoll.be  Fact Sheet for Healthcare Providers: https://www.woods-mathews.com/  This test is not yet approved or cleared by the [place] FDA and  has been authorized for detection and/or diagnosis of SARS-CoV-2 by FDA under an Emergency Use Authorization (EUA). This EUA will remain  in effect (meaning this test can be used) for the duration of the COVID-19 declaration under Se ction 564(b)(1) of the Act, 21 U.S.C. section 360bbb-3(b)(1), unless the authorization is terminated or revoked sooner.  Performed at [facility], [place] [address] [place], [place] [address]      Time coordinating discharge: 71mins  SIGNED:   Kathie Dike, MD  [facility] 05/06/2021, 10:04 AM   If 7PM-7AM, please  contact night-coverage [email or web address]

## 2021-05-07 LAB — URINE CULTURE: Culture: 50000 — AB

## 2021-10-28 ENCOUNTER — Ambulatory Visit
Admission: RE | Admit: 2021-10-28 | Discharge: 2021-10-28 | Disposition: A | Discharge status: Home / Self Care | Payer: Medicare Other | Source: Ambulatory Visit | Attending: Family Medicine | Admitting: Family Medicine

## 2021-10-28 ENCOUNTER — Other Ambulatory Visit: Payer: Self-pay

## 2021-10-28 VITALS — BP 168/94 | HR 86 | Temp 98.2°F | Resp 18

## 2021-10-28 DIAGNOSIS — R300 Dysuria: Secondary | ICD-10-CM | POA: Diagnosis present

## 2021-10-28 DIAGNOSIS — N3000 Acute cystitis without hematuria: Secondary | ICD-10-CM | POA: Diagnosis present

## 2021-10-28 LAB — POCT URINALYSIS DIP (MANUAL ENTRY)
Blood, UA: NEGATIVE
Glucose, UA: 250 mg/dL — AB
Nitrite, UA: POSITIVE — AB
Protein Ur, POC: >=300 mg/dL — AB
Spec Grav, UA: <=1.005 — AB (ref 1.010–1.025)
Urobilinogen, UA: >=8 E.U./dL — AB
pH, UA: 5 (ref 5.0–8.0)

## 2021-10-28 MED ORDER — CEPHALEXIN 500 MG PO CAPS: 500.0000 mg | ORAL_CAPSULE | Freq: Two times a day (BID) | ORAL | 0 refills | Status: AC

## 2021-10-28 NOTE — ED Provider Notes (Signed)
Roderic Palau    CSN: [id number] Arrival date & time: 10/28/21  1207      History   Chief Complaint No chief complaint on file.   HPI Rhonda Morales is a 70 y.o. female.   HPI Rhonda Morales is a 71 y.o. female presents for evaluation of urinary frequency, urgency and dysuria x 4-5  days, without flank pain, fever, chills, or abnormal vaginal discharge or bleeding.Previous history of UTI. Uncertain of prior urine pathology. No LMP recorded. Patient has had a hysterectomy.   Past Medical History:  Diagnosis Date   Anxiety    Chronic pain    Hyperlipidemia 05/04/2021   Interstitial cystitis 05/04/2021    Patient Active Problem List   Diagnosis Date Noted   Delirium 05/04/2021   Interstitial cystitis 05/04/2021   Hyperlipidemia 05/04/2021   Anxiety 05/04/2021   Acute lower UTI 05/04/2021   Chronic pain 05/04/2021    History reviewed. No pertinent surgical history.  OB History   No obstetric history on file.      Home Medications    Prior to Admission medications   Medication Sig Start Date End Date Taking? Authorizing Provider  cephALEXin (KEFLEX) 500 MG capsule Take 1 capsule (500 mg total) by mouth 2 (two) times daily for 7 days. 10/28/21 11/04/21 Yes Scot Jun, FNP  Biotin 10 MG TABS Take by mouth.    [provider]  cefdinir (OMNICEF) 300 MG capsule Take 1 capsule (300 mg total) by mouth 2 (two) times daily. 05/06/21   Kathie Dike, MD  gabapentin (NEURONTIN) 300 MG capsule Take 300 mg by mouth 3 (three) times daily.    [provider]  mirtazapine (REMERON) 30 MG tablet Take 30 mg by mouth at bedtime. 05/03/21   [provider]  Multiple Vitamin (MULTIVITAMIN) tablet Take 1 tablet by mouth daily.    [provider]  Pumpkin Seed-Soy Germ (AZO BLADDER CONTROL/GO-LESS PO) Take by mouth.    [provider]  vitamin B-12 (CYANOCOBALAMIN) 1000 MCG tablet Take 1 tablet (1,000 mcg total) by mouth daily.  05/06/21   Kathie Dike, MD  Vitamin D, Ergocalciferol, (DRISDOL) 1.25 MG (50000 UNIT) CAPS capsule  09/24/19   [provider]    Family History No family history on file.  Social History Social History   Tobacco Use   Smoking status: Never   Smokeless tobacco: Never  Vaping Use   Vaping Use: Never used  Substance Use Topics   Drug use: Never     Allergies   Hydrocodone, Estradiol, and Morphine and related   Review of Systems Review of Systems Pertinent negatives listed in HPI  Physical Exam Triage Vital Signs ED Triage Vitals  Enc Vitals Group     BP 10/28/21 1324 (!) 168/94     Pulse Rate 10/28/21 1324 86     Resp 10/28/21 1324 18     Temp 10/28/21 1324 98.2 F (36.8 C)     Temp Source 10/28/21 1324 Oral     SpO2 10/28/21 1324 92 %     Weight --      Height --      Head Circumference --      Peak Flow --      Pain Score 10/28/21 1321 6     Pain Loc --      Pain Edu? --      Excl. in [place]? --    No data found.  Updated Vital Signs BP (!) 168/94 (  BP Location: Right Arm)   Pulse 86   Temp 98.2 F (36.8 C) (Oral)   Resp 18   SpO2 92%   Visual Acuity Right Eye Distance:   Left Eye Distance:   Bilateral Distance:    Right Eye Near:   Left Eye Near:    Bilateral Near:     Physical Exam   UC Treatments / Results  Labs (all labs ordered are listed, but only abnormal results are displayed) Labs Reviewed  URINE CULTURE - Abnormal; Notable for the following components:      Result Value   Culture MULTIPLE SPECIES PRESENT, SUGGEST RECOLLECTION (*)    All other components within normal limits  POCT URINALYSIS DIP (MANUAL ENTRY) - Abnormal; Notable for the following components:   Color, UA orange (*)    Glucose, UA =250 (*)    Bilirubin, UA moderate (*)    Ketones, POC UA small (15) (*)    Spec Grav, UA <=1.005 (*)    Protein Ur, POC >=300 (*)    Urobilinogen, UA >=8.0 (*)    Nitrite, UA Positive (*)    Leukocytes, UA Large (3+) (*)     All other components within normal limits    EKG   Radiology No results found.  Procedures Procedures (including critical care time)  Medications Ordered in UC Medications - No data to display  Initial Impression / Assessment and Plan / UC Course  I have reviewed the triage vital signs and the nursing notes.  Pertinent labs & imaging results that were available during my care of the patient were reviewed by me and considered in my medical decision making (see chart for details).      UA abnormal and findings consistent with UTI. Empiric antibiotic treatment initiated.  Encouraged increase intake of water.  Urine culture pending.  ER if symptoms become severe. Follow-up with PCP if symptoms do not completely resolve.  Final Clinical Impressions(s) / UC Diagnoses   Final diagnoses:  Dysuria  Acute cystitis without hematuria     Discharge Instructions      Urine culture pending.  Start Keflex 500 mg twice daily for the next 7 days.  If urine culture warrants change in medication we will notify you by phone.     ED Prescriptions     Medication Sig Dispense Auth. Provider   cephALEXin (KEFLEX) 500 MG capsule Take 1 capsule (500 mg total) by mouth 2 (two) times daily for 7 days. 14 capsule Scot Jun, FNP      PDMP not reviewed this encounter.   Scot Jun, FNP 11/03/21 1527

## 2021-10-28 NOTE — ED Triage Notes (Signed)
Patient states she has had an infection for the past 4 to 5 days. She keeps getting bladder infections this year (4 to 5). She states her back and lower abdomin are hurting. It burns when urinating.  She states she has taken AZO with no relief. Last dose this morning at 10am.

## 2021-10-28 NOTE — Discharge Instructions (Signed)
Urine culture pending.  Start Keflex 500 mg twice daily for the next 7 days.  If urine culture warrants change in medication we will notify you by phone.

## 2021-10-29 LAB — URINE CULTURE

## 2021-11-09 ENCOUNTER — Ambulatory Visit: Payer: Medicare Other | Admitting: Urology

## 2021-11-12 ENCOUNTER — Ambulatory Visit (INDEPENDENT_AMBULATORY_CARE_PROVIDER_SITE_OTHER): Payer: Medicare Other | Admitting: Urology

## 2021-11-12 ENCOUNTER — Other Ambulatory Visit: Payer: Self-pay

## 2021-11-12 ENCOUNTER — Encounter: Payer: Self-pay | Admitting: Urology

## 2021-11-12 VITALS — BP 132/75 | HR 102

## 2021-11-12 DIAGNOSIS — N3000 Acute cystitis without hematuria: Secondary | ICD-10-CM

## 2021-11-12 DIAGNOSIS — N390 Urinary tract infection, site not specified: Secondary | ICD-10-CM

## 2021-11-12 MED ORDER — NITROFURANTOIN MACROCRYSTAL 50 MG PO CAPS: 50.0000 mg | ORAL_CAPSULE | Freq: Every day | ORAL | 11 refills | Status: DC

## 2021-11-12 MED ORDER — MIRABEGRON ER 25 MG PO TB24: 25.0000 mg | ORAL_TABLET | Freq: Every day | ORAL | 0 refills | Status: DC

## 2021-11-12 NOTE — Patient Instructions (Signed)

## 2021-11-12 NOTE — Progress Notes (Signed)
11/12/2021 12:03 PM   Gaspar Cola [birth year] [id number]  Referring provider: Jani Gravel, MD [place],  [place] [address]  Recurrent UTI   HPI: Ms Rhonda Morales is a 89TM here for followup for recurrent UTI. She gets 5-6x UTIs per year. She was hospitalized in 05/2021 with a UTI. Culture grew e.coli. She has a hx of IC and previously took oybutynin which worked fair. She has urinary frequency every 1 hour. She has nocturia 3-4x. Urine stream strong.  No dysuria or hematuria. UA today normal.   PMH: Past Medical History:  Diagnosis Date   Anxiety    Chronic pain    Hyperlipidemia 05/04/2021   Interstitial cystitis 05/04/2021    Surgical History: No past surgical history on file.  Home Medications:  Allergies as of 11/12/2021       Reactions   Hydrocodone Itching, Nausea Only   Estradiol Other (See Comments)   Cream burned   Morphine And Related    Unsure of type of reaction        Medication List        Accurate as of November 12, 2021 12:03 PM. If you have any questions, ask your nurse or doctor.          AZO BLADDER CONTROL/GO-LESS PO Take by mouth.   Biotin 10 MG Tabs Take by mouth.   cefdinir 300 MG capsule Commonly known as: OMNICEF Take 1 capsule (300 mg total) by mouth 2 (two) times daily.   cyclobenzaprine 10 MG tablet Commonly known as: FLEXERIL Take 10 mg by mouth 3 (three) times daily as needed for muscle spasms.   gabapentin 300 MG capsule Commonly known as: NEURONTIN Take 300 mg by mouth 3 (three) times daily.   mirtazapine 30 MG tablet Commonly known as: REMERON Take 30 mg by mouth at bedtime.   multivitamin tablet Take 1 tablet by mouth daily.   QUEtiapine 25 MG tablet Commonly known as: SEROQUEL Take 25-50 mg by mouth at bedtime.   vitamin B-12 1000 MCG tablet Commonly known as: CYANOCOBALAMIN Take 1 tablet (1,000 mcg total) by mouth daily.   Vitamin D (Ergocalciferol) 1.25 MG (50000 UNIT) Caps  capsule Commonly known as: DRISDOL        Allergies:  Allergies  Allergen Reactions   Hydrocodone Itching and Nausea Only   Estradiol Other (See Comments)    Cream burned   Morphine And Related     Unsure of type of reaction    Family History: No family history on file.  Social History:  reports that she has never smoked. She has never used smokeless tobacco. She reports that she does not use drugs. No history on file for alcohol use.  ROS: All other review of systems were reviewed and are negative except what is noted above in HPI  Physical Exam: BP 132/75   Pulse (!) 102   Constitutional:  Alert and oriented, No acute distress. HEENT: [place] AT, moist mucus membranes.  Trachea midline, no masses. Cardiovascular: No clubbing, cyanosis, or edema. Respiratory: Normal respiratory effort, no increased work of breathing. GI: Abdomen is soft, nontender, nondistended, no abdominal masses GU: No CVA tenderness.  Lymph: No cervical or inguinal lymphadenopathy. Skin: No rashes, bruises or suspicious lesions. Neurologic: Grossly intact, no focal deficits, moving all 4 extremities. Psychiatric: Normal mood and affect.  Laboratory Data: Lab Results  Component Value Date   WBC 9.4 05/05/2021   HGB 12.3 05/05/2021   HCT 38.8 05/05/2021  MCV 87.2 05/05/2021   PLT 223 05/05/2021    Lab Results  Component Value Date   CREATININE 0.66 05/05/2021    No results found for: PSA  No results found for: TESTOSTERONE  No results found for: HGBA1C  Urinalysis    Component Value Date/Time   COLORURINE YELLOW 05/04/2021 1706   APPEARANCEUR HAZY (A) 05/04/2021 1706   APPEARANCEUR Clear 08/09/2020 1646   LABSPEC 1.023 05/04/2021 1706   PHURINE 5.0 05/04/2021 1706   GLUCOSEU NEGATIVE 05/04/2021 1706   HGBUR SMALL (A) 05/04/2021 1706   BILIRUBINUR moderate (A) 10/28/2021 1335   BILIRUBINUR Negative 08/09/2020 1646   KETONESUR small (15) (A) 10/28/2021 1335   KETONESUR NEGATIVE  05/04/2021 1706   PROTEINUR >=300 (A) 10/28/2021 1335   PROTEINUR NEGATIVE 05/04/2021 1706   UROBILINOGEN >=8.0 (A) 10/28/2021 1335   UROBILINOGEN 0.2 04/15/2008 1444   NITRITE Positive (A) 10/28/2021 1335   NITRITE NEGATIVE 05/04/2021 1706   LEUKOCYTESUR Large (3+) (A) 10/28/2021 1335   LEUKOCYTESUR MODERATE (A) 05/04/2021 1706    Lab Results  Component Value Date   LABMICR See below: 08/09/2020   WBCUA >30 (A) 08/09/2020   LABEPIT 0-10 08/09/2020   BACTERIA MANY (A) 05/04/2021    Pertinent Imaging:  No results found for this or any previous visit.  No results found for this or any previous visit.  No results found for this or any previous visit.  No results found for this or any previous visit.  No results found for this or any previous visit.  No results found for this or any previous visit.  No results found for this or any previous visit.  No results found for this or any previous visit.   Assessment & Plan:    1.  Recurrent UTI -We will trial macrobid 50mg  qhs - Urinalysis, Routine w reflex microscopic - BLADDER SCAN AMB NON-IMAGING  2. Urinary urgency/frequency -We will trial mirabegron 25mg  daily  No follow-ups on file.  Nicolette Bang, MD  [facility] [place]

## 2021-11-12 NOTE — Progress Notes (Signed)
post void residual=94  Urological Symptom Review  Patient is experiencing the following symptoms: Frequent urination Burning/pain with urination Get up at night to urinate Trouble starting stream Urinary tract infection Painful intercourse   Review of Systems  Gastrointestinal (upper)  : Negative for upper GI symptoms  Gastrointestinal (lower) : Negative for lower GI symptoms  Constitutional : Fatigue  Skin: Negative for skin symptoms  Eyes: Blurred vision  Ear/Nose/Throat : Sinus problems  Hematologic/Lymphatic: Easy bruising  Cardiovascular : Negative for cardiovascular symptoms  Respiratory : Negative for respiratory symptoms  Endocrine: Negative for endocrine symptoms  Musculoskeletal: Back pain  Neurological: Negative for neurological symptoms  Psychologic: Negative for psychiatric symptoms

## 2021-11-13 LAB — URINALYSIS, ROUTINE W REFLEX MICROSCOPIC
Bilirubin, UA: NEGATIVE
Glucose, UA: NEGATIVE
Ketones, UA: NEGATIVE
Nitrite, UA: NEGATIVE
Protein,UA: NEGATIVE
Specific Gravity, UA: 1.02 (ref 1.005–1.030)
Urobilinogen, Ur: 0.2 mg/dL (ref 0.2–1.0)
pH, UA: 6 (ref 5.0–7.5)

## 2021-11-13 LAB — MICROSCOPIC EXAMINATION: Renal Epithel, UA: NONE SEEN /hpf

## 2021-11-16 LAB — URINE CULTURE

## 2021-11-19 ENCOUNTER — Telehealth: Payer: Self-pay

## 2021-11-19 NOTE — Telephone Encounter (Signed)
Pt was called and informed to continue macrodantin as there is no need for additional antibiotics. Pt was undestanding

## 2021-11-19 NOTE — Telephone Encounter (Signed)
-----   Message from [facility], [place] sent at 11/16/2021 10:51 AM EST ----- Urine cx indicates no need for additional antibx. Pt needs to continue suppression tx of macrodantin HS ----- Message ----- From: Dorisann Frames, RN Sent: 11/16/2021   8:58 AM EST To: Cleon Gustin, MD  Please review

## 2021-11-22 ENCOUNTER — Telehealth: Payer: Self-pay

## 2021-11-22 NOTE — Telephone Encounter (Signed)
Pt called

## 2021-11-22 NOTE — Telephone Encounter (Signed)
Pt called and wanted to know if she can get a letter to excuse her from [employer]. Pt said due her medical issues Acute Cystitis she will not be able service. Advised pt she might also want reach out to her PCP  for this if we are not able give her one.Please advise

## 2021-11-29 NOTE — Telephone Encounter (Signed)
Called  left detail voicemail advising pt she will need to get letter for [employer] from pcp.

## 2021-11-29 NOTE — Telephone Encounter (Signed)
Pt called back informed her of message

## 2021-12-26 ENCOUNTER — Other Ambulatory Visit: Payer: Self-pay

## 2021-12-26 ENCOUNTER — Ambulatory Visit (INDEPENDENT_AMBULATORY_CARE_PROVIDER_SITE_OTHER): Payer: Medicare Other | Admitting: Urology

## 2021-12-26 ENCOUNTER — Encounter: Payer: Self-pay | Admitting: Urology

## 2021-12-26 VITALS — BP 167/80 | HR 99 | Wt 147.0 lb

## 2021-12-26 DIAGNOSIS — N390 Urinary tract infection, site not specified: Secondary | ICD-10-CM

## 2021-12-26 DIAGNOSIS — N3000 Acute cystitis without hematuria: Secondary | ICD-10-CM | POA: Diagnosis not present

## 2021-12-26 DIAGNOSIS — N3281 Overactive bladder: Secondary | ICD-10-CM

## 2021-12-26 LAB — URINALYSIS, ROUTINE W REFLEX MICROSCOPIC
Bilirubin, UA: NEGATIVE
Glucose, UA: NEGATIVE
Ketones, UA: NEGATIVE
Nitrite, UA: NEGATIVE
Protein,UA: NEGATIVE
Specific Gravity, UA: 1.02 (ref 1.005–1.030)
Urobilinogen, Ur: 0.2 mg/dL (ref 0.2–1.0)
pH, UA: 6.5 (ref 5.0–7.5)

## 2021-12-26 LAB — MICROSCOPIC EXAMINATION: Renal Epithel, UA: NONE SEEN /hpf

## 2021-12-26 MED ORDER — MIRABEGRON ER 25 MG PO TB24: 25.0000 mg | ORAL_TABLET | Freq: Every day | ORAL | 11 refills | Status: DC

## 2021-12-26 MED ORDER — NITROFURANTOIN MACROCRYSTAL 50 MG PO CAPS: 50.0000 mg | ORAL_CAPSULE | Freq: Every day | ORAL | 11 refills | Status: DC

## 2021-12-26 NOTE — Progress Notes (Signed)
Urological Symptom Review  Patient is experiencing the following symptoms: Frequent urination Get up at night to urinate Trouble starting stream   Review of Systems  Gastrointestinal (upper)  : Negative for upper GI symptoms  Gastrointestinal (lower) : Negative for lower GI symptoms  Constitutional : Fatigue  Skin: Negative for skin symptoms  Eyes: Negative for eye symptoms  Ear/Nose/Throat : Negative for Ear/Nose/Throat symptoms  Hematologic/Lymphatic: Negative for Hematologic/Lymphatic symptoms  Cardiovascular : Negative for cardiovascular symptoms  Respiratory : Negative for respiratory symptoms  Endocrine: Negative for endocrine symptoms  Musculoskeletal: Negative for musculoskeletal symptoms  Neurological: Negative for neurological symptoms  Psychologic: Negative for psychiatric symptoms

## 2021-12-26 NOTE — Progress Notes (Signed)
12/26/2021 1:42 PM   Gaspar Cola 14-Dec-1950 [id number]  Referring provider: Jani Gravel, MD [place],  [place] [address]  Followup recurrent UTI and OAB   HPI: Ms Rhonda Morales is a 33DV here for followup for recurrent UTI and OAB. She has had 2 UTIs since last visit on macrobid prophylaxis. No dysuria or hematuria currently. She continues to have urinary frequency every 30-60 minutes, severe urgency, and daily urge incontinence. She uses 3-4 pads per day. Urine stream strong. NO feeling of incomplete emptying. No other complaints today   PMH: Past Medical History:  Diagnosis Date   Anxiety    Chronic pain    Hyperlipidemia 05/04/2021   Interstitial cystitis 05/04/2021    Surgical History: No past surgical history on file.  Home Medications:  Allergies as of 12/26/2021       Reactions   Hydrocodone Itching, Nausea Only   Estradiol Other (See Comments)   Cream burned   Morphine And Related    Unsure of type of reaction        Medication List        Accurate as of December 26, 2021  1:42 PM. If you have any questions, ask your nurse or doctor.          STOP taking these medications    cefdinir 300 MG capsule Commonly known as: OMNICEF Stopped by: Nicolette Bang, MD       TAKE these medications    AZO BLADDER CONTROL/GO-LESS PO Take by mouth.   Biotin 10 MG Tabs Take by mouth.   cyclobenzaprine 10 MG tablet Commonly known as: FLEXERIL Take 10 mg by mouth 3 (three) times daily as needed for muscle spasms.   gabapentin 300 MG capsule Commonly known as: NEURONTIN Take 300 mg by mouth 3 (three) times daily.   mirabegron ER 25 MG Tb24 tablet Commonly known as: MYRBETRIQ Take 1 tablet (25 mg total) by mouth daily.   mirtazapine 30 MG tablet Commonly known as: REMERON Take 30 mg by mouth at bedtime.   multivitamin tablet Take 1 tablet by mouth daily.   nitrofurantoin 50 MG capsule Commonly known as: MACRODANTIN Take 1  capsule (50 mg total) by mouth at bedtime.   QUEtiapine 25 MG tablet Commonly known as: SEROQUEL Take 25-50 mg by mouth at bedtime.   vitamin B-12 1000 MCG tablet Commonly known as: CYANOCOBALAMIN Take 1 tablet (1,000 mcg total) by mouth daily.   Vitamin D (Ergocalciferol) 1.25 MG (50000 UNIT) Caps capsule Commonly known as: DRISDOL        Allergies:  Allergies  Allergen Reactions   Hydrocodone Itching and Nausea Only   Estradiol Other (See Comments)    Cream burned   Morphine And Related     Unsure of type of reaction    Family History: No family history on file.  Social History:  reports that she has never smoked. She has never used smokeless tobacco. She reports that she does not use drugs. No history on file for alcohol use.  ROS: All other review of systems were reviewed and are negative except what is noted above in HPI  Physical Exam: BP (!) 167/80    Pulse 99    Wt 147 lb (66.7 kg)    BMI 26.89 kg/m   Constitutional:  Alert and oriented, No acute distress. HEENT: [place] AT, moist mucus membranes.  Trachea midline, no masses. Cardiovascular: No clubbing, cyanosis, or edema. Respiratory: Normal respiratory effort, no increased work  of breathing. GI: Abdomen is soft, nontender, nondistended, no abdominal masses GU: No CVA tenderness.  Lymph: No cervical or inguinal lymphadenopathy. Skin: No rashes, bruises or suspicious lesions. Neurologic: Grossly intact, no focal deficits, moving all 4 extremities. Psychiatric: Normal mood and affect.  Laboratory Data: Lab Results  Component Value Date   WBC 9.4 05/05/2021   HGB 12.3 05/05/2021   HCT 38.8 05/05/2021   MCV 87.2 05/05/2021   PLT 223 05/05/2021    Lab Results  Component Value Date   CREATININE 0.66 05/05/2021    No results found for: PSA  No results found for: TESTOSTERONE  No results found for: HGBA1C  Urinalysis    Component Value Date/Time   COLORURINE YELLOW 05/04/2021 1706   APPEARANCEUR  Clear 11/12/2021 1206   LABSPEC 1.023 05/04/2021 1706   PHURINE 5.0 05/04/2021 1706   GLUCOSEU Negative 11/12/2021 1206   HGBUR SMALL (A) 05/04/2021 1706   BILIRUBINUR Negative 11/12/2021 1206   KETONESUR small (15) (A) 10/28/2021 1335   KETONESUR NEGATIVE 05/04/2021 1706   PROTEINUR Negative 11/12/2021 1206   PROTEINUR NEGATIVE 05/04/2021 1706   UROBILINOGEN >=8.0 (A) 10/28/2021 1335   UROBILINOGEN 0.2 04/15/2008 1444   NITRITE Negative 11/12/2021 1206   NITRITE NEGATIVE 05/04/2021 1706   LEUKOCYTESUR Trace (A) 11/12/2021 1206   LEUKOCYTESUR MODERATE (A) 05/04/2021 1706    Lab Results  Component Value Date   LABMICR See below: 11/12/2021   WBCUA 0-5 11/12/2021   LABEPIT 0-10 11/12/2021   BACTERIA Few (A) 11/12/2021    Pertinent Imaging:  No results found for this or any previous visit.  No results found for this or any previous visit.  No results found for this or any previous visit.  No results found for this or any previous visit.  No results found for this or any previous visit.  No results found for this or any previous visit.  No results found for this or any previous visit.  No results found for this or any previous visit.   Assessment & Plan:    1. Recurrent UTI Continue macrobid 50mg  qhs  2. OAB -mirabegron 25mg  daily  No follow-ups on file.  Nicolette Bang, MD  [facility] [place]

## 2021-12-31 NOTE — Patient Instructions (Signed)

## 2022-03-16 IMAGING — CT CT HEAD W/O CM
2 of 6 series · 13 of 47 positions shown, 16 images · non-contrast
Comparison: March 25, 2012

CLINICAL DATA: Altered mental status.

EXAM:
CT HEAD WITHOUT CONTRAST
TECHNIQUE: Contiguous axial images were obtained from the base of the skull
through the vertex without intravenous contrast.

[Series 2: head w o · axial · 0.52mm/px · z∈[+31,+166]mm · 10 of 33 slices shown, 13 images]
[im 4/33  brain]
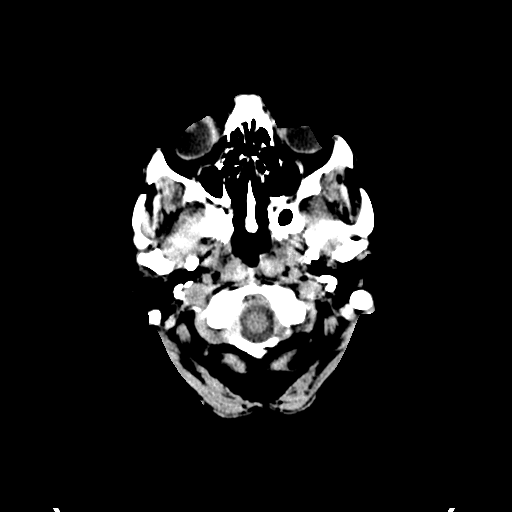
[im 4/33  bone]
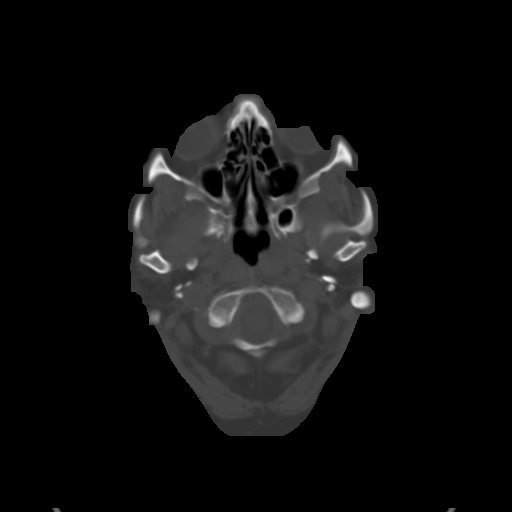
[im 7/33  brain]
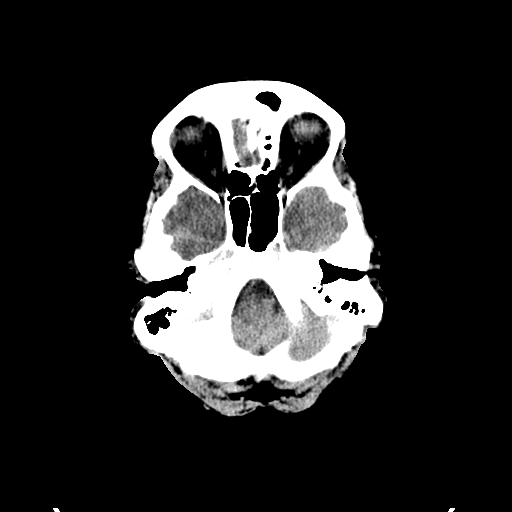
[im 10/33  brain]
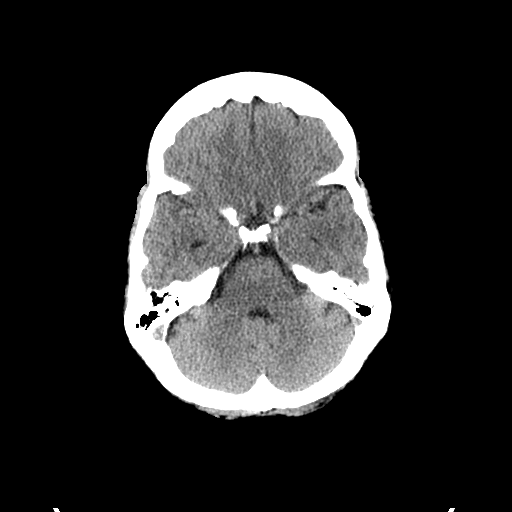
[im 13/33  brain]
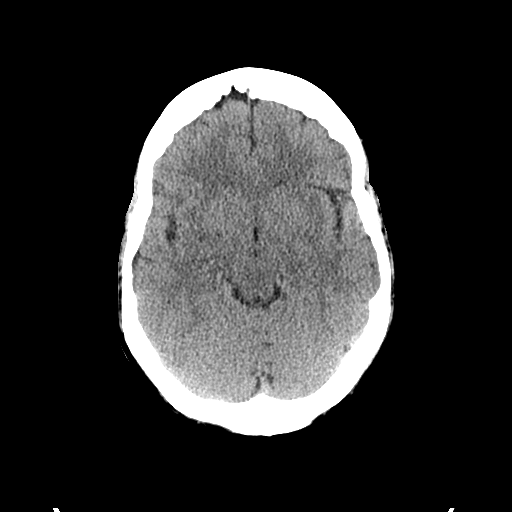
[im 16/33  brain]
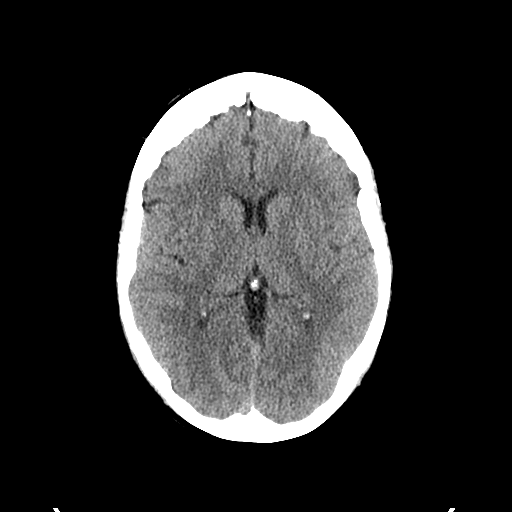
[im 16/33  bone]
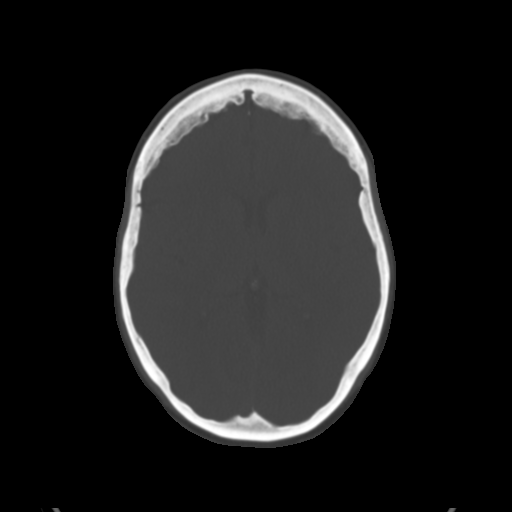
[im 19/33  brain]
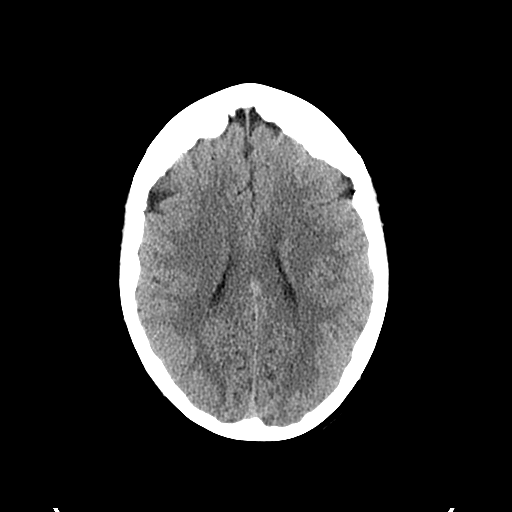
[im 22/33  brain]
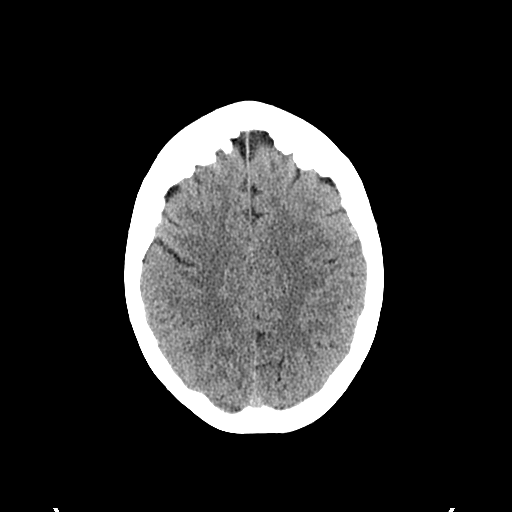
[im 25/33  brain]
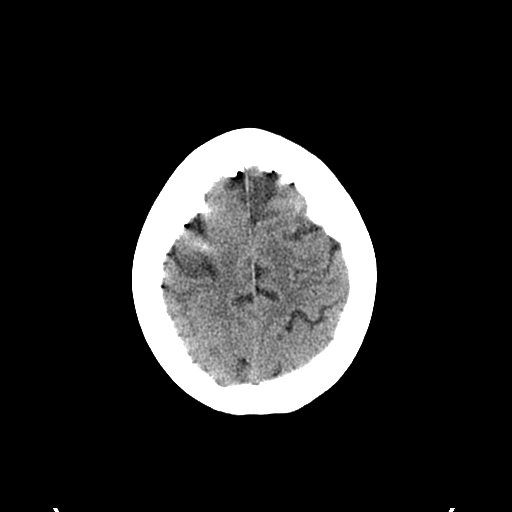
[im 28/33  brain]
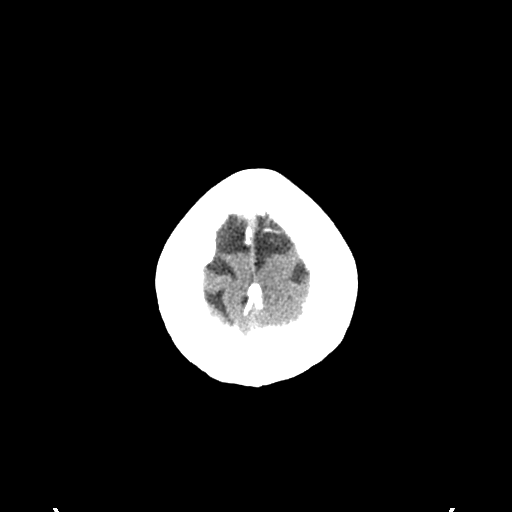
[im 28/33  bone]
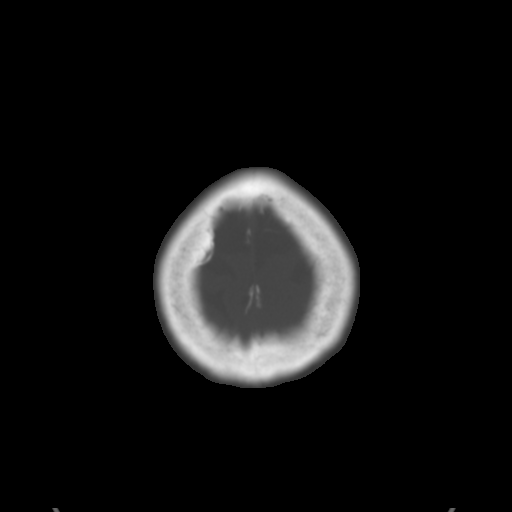
[im 31/33  brain]
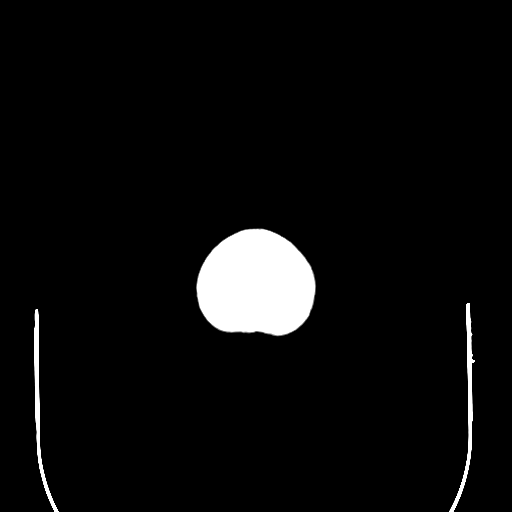

[Series 8: coronal soft · coronal · 0.16mm/px · 3 of 70 slices shown]
[im 20/70  brain]
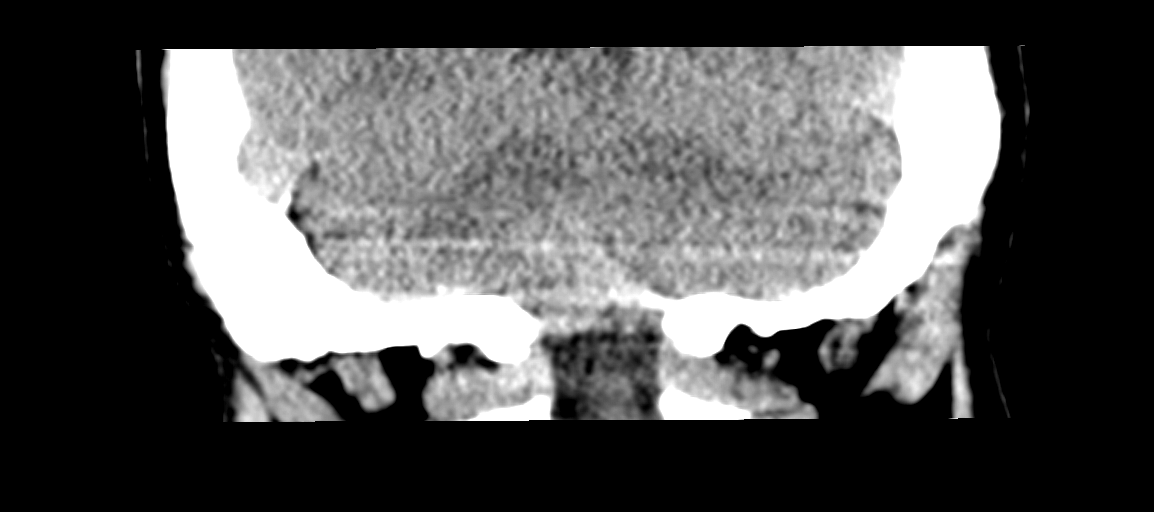
[im 30/70  brain]
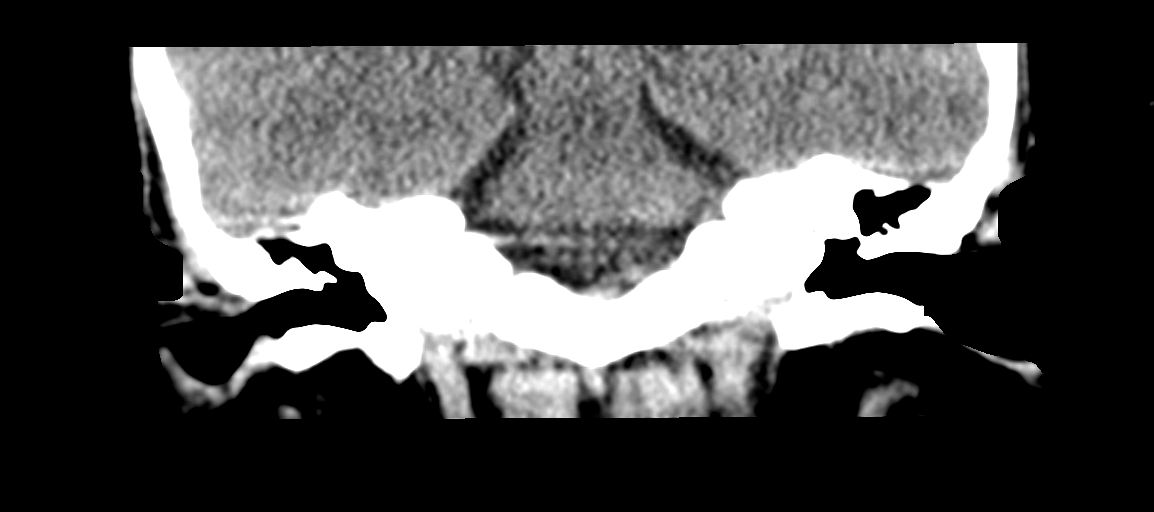
[im 40/70  brain]
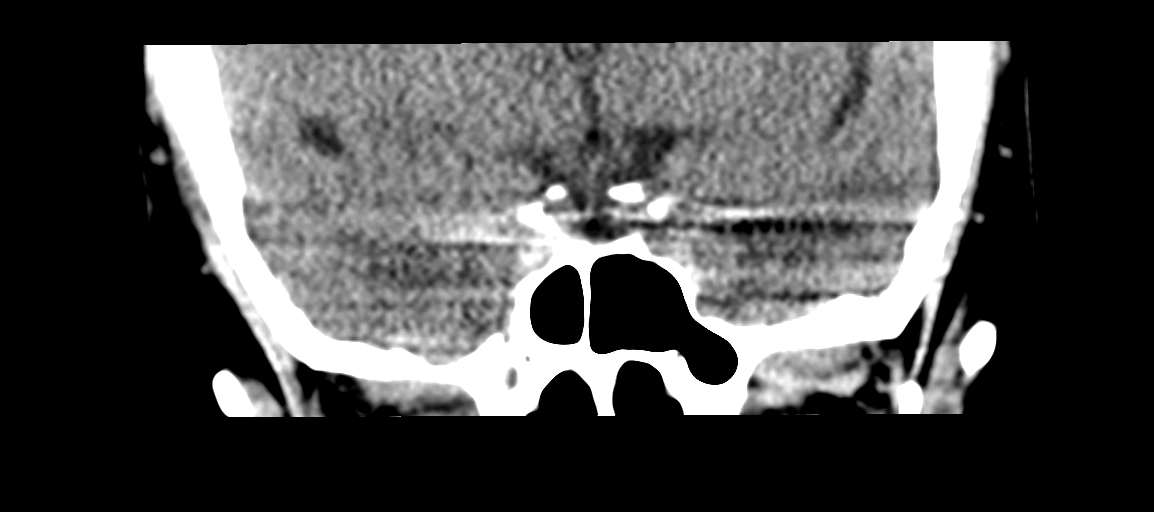

[13 of 47 positions shown; findings below may reference images not displayed]

FINDINGS: Brain: No evidence of acute infarction, hemorrhage, hydrocephalus,
extra-axial collection or mass lesion/mass effect.

Mild, stable left basal ganglia calcification is noted.

Vascular: No hyperdense vessels are identified.

Skull: Normal. Negative for fracture or focal lesion.

Sinuses/Orbits: No acute finding.

Other: None.
IMPRESSION: No acute intracranial abnormality.

## 2022-06-13 ENCOUNTER — Ambulatory Visit (HOSPITAL_COMMUNITY): Payer: Medicare Other | Attending: Physical Therapy | Admitting: Physical Therapy

## 2022-08-16 ENCOUNTER — Other Ambulatory Visit: Payer: Self-pay

## 2022-08-16 ENCOUNTER — Ambulatory Visit
Admission: RE | Admit: 2022-08-16 | Discharge: 2022-08-16 | Disposition: A | Discharge status: Home / Self Care | Payer: Medicare Other | Source: Ambulatory Visit | Attending: Nurse Practitioner | Admitting: Nurse Practitioner

## 2022-08-16 VITALS — BP 177/99 | HR 91 | Temp 98.2°F | Resp 20

## 2022-08-16 DIAGNOSIS — Z87440 Personal history of urinary (tract) infections: Secondary | ICD-10-CM | POA: Diagnosis present

## 2022-08-16 DIAGNOSIS — R350 Frequency of micturition: Secondary | ICD-10-CM | POA: Diagnosis present

## 2022-08-16 DIAGNOSIS — I10 Essential (primary) hypertension: Secondary | ICD-10-CM | POA: Diagnosis present

## 2022-08-16 LAB — POCT URINALYSIS DIP (MANUAL ENTRY)
Bilirubin, UA: NEGATIVE
Blood, UA: NEGATIVE
Glucose, UA: NEGATIVE mg/dL
Ketones, POC UA: NEGATIVE mg/dL
Nitrite, UA: NEGATIVE
Protein Ur, POC: NEGATIVE mg/dL
Spec Grav, UA: 1.02 (ref 1.010–1.025)
Urobilinogen, UA: 0.2 E.U./dL
pH, UA: 7 (ref 5.0–8.0)

## 2022-08-16 MED ORDER — CEPHALEXIN 500 MG PO CAPS: 500.0000 mg | ORAL_CAPSULE | Freq: Four times a day (QID) | ORAL | 0 refills | Status: AC

## 2022-08-16 NOTE — ED Provider Notes (Signed)
[facility]    CSN: [id number] Arrival date & time: 08/16/22  1022      History   Chief Complaint Chief Complaint  Patient presents with   Urinary Frequency    HPI Rhonda Morales is a 71 y.o. female.   Patient presents with symptoms for the past couple of weeks.  Reports she started amlodipine for her blood pressure couple of weeks ago, currently taking 2.5 mg daily.  Did not take her blood pressure medication yet today.  Reports her blood pressure at home has been 120s over 80s.  She endorses headache, dizziness, lower abdominal cramping, nausea with vomiting yesterday.  Has also felt hot and cold chills, increased urinary frequency and urgency, foul urinary odor, and suprapubic pressure.  No dysuria, no new or worsened urinary incontinence, hematuria, new back pain or flank pain, or fever.  No chest pain or shortness of breath.  No palpitations.  Been eating and drinking plenty of liquids.  Reports a history of recurrent UTI, currently takes Macrobid 50 mg nightly for prevention.  Last UTI was about 1 year ago.  Does follow with urologist.  She wonders if her symptoms are related to the new blood pressure medicine.  Reports she has a follow-up with her primary care provider in 6 days.    Past Medical History:  Diagnosis Date   Anxiety    Chronic pain    Hyperlipidemia 05/04/2021   Interstitial cystitis 05/04/2021    Patient Active Problem List   Diagnosis Date Noted   Delirium 05/04/2021   Interstitial cystitis 05/04/2021   Hyperlipidemia 05/04/2021   Anxiety 05/04/2021   Acute lower UTI 05/04/2021   Chronic pain 05/04/2021    History reviewed. No pertinent surgical history.  OB History   No obstetric history on file.      Home Medications    Prior to Admission medications   Medication Sig Start Date End Date Taking? Authorizing Provider  amLODipine (NORVASC) 10 MG tablet Take 10 mg by mouth daily.   Yes [provider]  cephALEXin (KEFLEX) 500  MG capsule Take 1 capsule (500 mg total) by mouth 4 (four) times daily for 5 days. 08/16/22 08/21/22 Yes Eulogio Bear, NP  Biotin 10 MG TABS Take by mouth.    [provider]  cyclobenzaprine (FLEXERIL) 10 MG tablet Take 10 mg by mouth 3 (three) times daily as needed for muscle spasms.    [provider]  gabapentin (NEURONTIN) 300 MG capsule Take 300 mg by mouth 3 (three) times daily.    [provider]  mirabegron ER (MYRBETRIQ) 25 MG TB24 tablet Take 1 tablet (25 mg total) by mouth daily. 12/26/21   McKenzie, Candee Furbish, MD  mirtazapine (REMERON) 30 MG tablet Take 30 mg by mouth at bedtime. 05/03/21   [provider]  Multiple Vitamin (MULTIVITAMIN) tablet Take 1 tablet by mouth daily.    [provider]  nitrofurantoin (MACRODANTIN) 50 MG capsule Take 1 capsule (50 mg total) by mouth at bedtime. 12/26/21   McKenzie, Candee Furbish, MD  Pumpkin Seed-Soy Germ (AZO BLADDER CONTROL/GO-LESS PO) Take by mouth.    [provider]  QUEtiapine (SEROQUEL) 25 MG tablet Take 25-50 mg by mouth at bedtime. 10/21/21   [provider]  vitamin B-12 (CYANOCOBALAMIN) 1000 MCG tablet Take 1 tablet (1,000 mcg total) by mouth daily. 05/06/21   Kathie Dike, MD  Vitamin D, Ergocalciferol, (DRISDOL) 1.25 MG (50000 UNIT) CAPS capsule  09/24/19   [provider]    Family History History reviewed. No pertinent family history.  Social History Social History   Tobacco Use   Smoking status: Never   Smokeless tobacco: Never  Vaping Use   Vaping Use: Never used  Substance Use Topics   Drug use: Never     Allergies   Hydrocodone, Estradiol, and Morphine and related   Review of Systems Review of Systems Per HPI  Physical Exam Triage Vital Signs ED Triage Vitals [08/16/22 1041]  Enc Vitals Group     BP      Pulse      Resp      Temp      Temp src      SpO2      Weight      Height      Head Circumference      Peak Flow       Pain Score 5     Pain Loc      Pain Edu?      Excl. in [place]?    No data found.  Updated Vital Signs BP (!) 177/116 (BP Location: Right Arm) Comment: vitals obtained by NP.  Pulse 91   Temp 98.2 F (36.8 C) (Oral)   Resp 20   SpO2 94%   BP recheck: 177/99  Visual Acuity Right Eye Distance:   Left Eye Distance:   Bilateral Distance:    Right Eye Near:   Left Eye Near:    Bilateral Near:     Physical Exam Vitals and nursing note reviewed.  Constitutional:      Appearance: Normal appearance.  HENT:     Head: Normocephalic and atraumatic.  Cardiovascular:     Rate and Rhythm: Normal rate and regular rhythm.  Pulmonary:     Effort: Pulmonary effort is normal. No respiratory distress.     Breath sounds: No stridor. No wheezing, rhonchi or rales.  Abdominal:     General: Abdomen is flat. Bowel sounds are normal.     Palpations: Abdomen is soft.     Tenderness: There is abdominal tenderness in the right lower quadrant, suprapubic area and left lower quadrant. There is no right CVA tenderness, left CVA tenderness, guarding or rebound. Negative signs include Murphy's sign, Rovsing's sign and McBurney's sign.  Skin:    General: Skin is warm and dry.     Capillary Refill: Capillary refill takes less than 2 seconds.     Coloration: Skin is not jaundiced or pale.     Findings: No erythema.  Neurological:     Mental Status: She is alert and oriented to person, place, and time.  Psychiatric:        Behavior: Behavior is cooperative.     UC Treatments / Results  Labs (all labs ordered are listed, but only abnormal results are displayed) Labs Reviewed  POCT URINALYSIS DIP (MANUAL ENTRY) - Abnormal; Notable for the following components:      Result Value   Leukocytes, UA Small (1+) (*)    All other components within normal limits  URINE CULTURE    EKG   Radiology No results found.  Procedures Procedures (including critical care time)  Medications Ordered in  UC Medications - No data to display  Initial Impression / Assessment and Plan / UC Course  I have reviewed the triage vital signs and the nursing notes.  Pertinent labs & imaging results that were available during my care of the patient were reviewed by me and considered in  my medical decision making (see chart for details).    Patient is well-appearing, afebrile, not tachycardic, not tachypneic, oxygenating well on room air.  She is slightly hypertensive today in urgent care, however without symptoms.  She did not take blood pressure medication yet today.  Urinalysis is suggestive of a urinary tract infection-urine culture is pending.  In the meantime, will treat with Keflex 500 mg 4 times daily for 5 days.  Encouraged continuing blood pressure medication, pushing hydration, and plan to follow-up with PCP as scheduled.  ER precautions and return precautions discussed.  The patient was given the opportunity to ask questions.  All questions answered to their satisfaction.  The patient is in agreement to this plan.   Final Clinical Impressions(s) / UC Diagnoses   Final diagnoses:  Urinary frequency  History of recurrent UTI (urinary tract infection)  Essential hypertension     Discharge Instructions      - Urinalysis today shows a little bit of white blood cells.  Your symptoms are consistent with a urinary tract infection, please start on the Keflex and take it 4 times daily for 5 days.  Urine culture is pending. -Continue the blood pressure medicine as prescribed by your primary care provider.  If it continues to cause symptoms after he finished treatment for the urinary tract infection, please follow-up with your primary care provider as planned. -With any chest pain, shortness of breath, or passing out, go to the emergency room.     ED Prescriptions     Medication Sig Dispense Auth. Provider   cephALEXin (KEFLEX) 500 MG capsule Take 1 capsule (500 mg total) by mouth 4 (four) times  daily for 5 days. 20 capsule Eulogio Bear, NP      PDMP not reviewed this encounter.   Eulogio Bear, NP 08/16/22 [phone]

## 2022-08-16 NOTE — Discharge Instructions (Addendum)
-   Urinalysis today shows a little bit of white blood cells.  Your symptoms are consistent with a urinary tract infection, please start on the Keflex and take it 4 times daily for 5 days.  Urine culture is pending. -Continue the blood pressure medicine as prescribed by your primary care provider.  If it continues to cause symptoms after he finished treatment for the urinary tract infection, please follow-up with your primary care provider as planned. -With any chest pain, shortness of breath, or passing out, go to the emergency room.

## 2022-08-16 NOTE — ED Triage Notes (Signed)
Pt reports urinary frequency and recurrent history of UTI. Pt reports abdominal cramping, headache for last several days.   Pt also reports was started on new bp medication and is wondering if symptoms could be side effect of medicine.

## 2022-08-18 LAB — URINE CULTURE: Culture: 10000 — AB

## 2023-03-06 ENCOUNTER — Other Ambulatory Visit: Payer: Self-pay | Admitting: Urology

## 2023-03-06 DIAGNOSIS — N3281 Overactive bladder: Secondary | ICD-10-CM

## 2023-03-06 DIAGNOSIS — N3000 Acute cystitis without hematuria: Secondary | ICD-10-CM

## 2023-04-14 ENCOUNTER — Other Ambulatory Visit: Payer: Self-pay | Admitting: Urology

## 2023-04-14 DIAGNOSIS — N3281 Overactive bladder: Secondary | ICD-10-CM

## 2023-04-14 DIAGNOSIS — N3000 Acute cystitis without hematuria: Secondary | ICD-10-CM

## 2023-05-06 DIAGNOSIS — H7442 Polyp of left middle ear: Secondary | ICD-10-CM | POA: Insufficient documentation

## 2023-05-23 ENCOUNTER — Ambulatory Visit (INDEPENDENT_AMBULATORY_CARE_PROVIDER_SITE_OTHER): Payer: Medicare Other | Admitting: Urology

## 2023-05-23 VITALS — BP 165/85 | HR 78 | Ht 62.0 in | Wt 147.0 lb

## 2023-05-23 DIAGNOSIS — N390 Urinary tract infection, site not specified: Secondary | ICD-10-CM

## 2023-05-23 DIAGNOSIS — Z87440 Personal history of urinary (tract) infections: Secondary | ICD-10-CM | POA: Diagnosis not present

## 2023-05-23 DIAGNOSIS — N3000 Acute cystitis without hematuria: Secondary | ICD-10-CM

## 2023-05-23 DIAGNOSIS — N3281 Overactive bladder: Secondary | ICD-10-CM | POA: Diagnosis not present

## 2023-05-23 LAB — URINALYSIS, ROUTINE W REFLEX MICROSCOPIC
Bilirubin, UA: NEGATIVE
Glucose, UA: NEGATIVE
Ketones, UA: NEGATIVE
Nitrite, UA: NEGATIVE
Protein,UA: NEGATIVE
Specific Gravity, UA: 1.015 (ref 1.005–1.030)
Urobilinogen, Ur: 0.2 mg/dL (ref 0.2–1.0)
pH, UA: 6 (ref 5.0–7.5)

## 2023-05-23 LAB — MICROSCOPIC EXAMINATION: Bacteria, UA: NONE SEEN

## 2023-05-23 MED ORDER — MIRABEGRON ER 25 MG PO TB24: 25.0000 mg | ORAL_TABLET | Freq: Every day | ORAL | 11 refills | Status: DC

## 2023-05-23 MED ORDER — NITROFURANTOIN MACROCRYSTAL 50 MG PO CAPS: 50.0000 mg | ORAL_CAPSULE | Freq: Every day | ORAL | 11 refills | Status: DC

## 2023-05-23 NOTE — Progress Notes (Unsigned)
05/23/2023 12:38 PM   Rhonda Morales 1951/07/23 [id number]  Referring provider: Pearson Grippe, MD [address] [place],  [place] [address]  Followup OAB and recurrent   HPI: Ms Wainwright is a 72yo here for followup for OAB and recurrent UTI. NO UTIs sisnce last visit. She denies any worsening urgency and frequency with mirabegron   PMH: Past Medical History:  Diagnosis Date   Anxiety    Chronic pain    Hyperlipidemia 05/04/2021   Interstitial cystitis 05/04/2021    Surgical History: No past surgical history on file.  Home Medications:  Allergies as of 05/23/2023       Reactions   Hydrocodone Itching, Nausea Only   Estradiol Other (See Comments)   Cream burned   Morphine And Codeine    Unsure of type of reaction        Medication List        Accurate as of May 23, 2023 12:38 PM. If you have any questions, ask your nurse or doctor.          amLODipine 10 MG tablet Commonly known as: NORVASC Take 10 mg by mouth daily.   AZO BLADDER CONTROL/GO-LESS PO Take by mouth.   Biotin 10 MG Tabs Take by mouth.   cyanocobalamin 1000 MCG tablet Commonly known as: VITAMIN B12 Take 1 tablet (1,000 mcg total) by mouth daily.   cyclobenzaprine 10 MG tablet Commonly known as: FLEXERIL Take 10 mg by mouth 3 (three) times daily as needed for muscle spasms.   gabapentin 300 MG capsule Commonly known as: NEURONTIN Take 300 mg by mouth 3 (three) times daily.   mirabegron ER 25 MG Tb24 tablet Commonly known as: MYRBETRIQ Take 1 tablet (25 mg total) by mouth daily.   mirtazapine 30 MG tablet Commonly known as: REMERON Take 30 mg by mouth at bedtime.   multivitamin tablet Take 1 tablet by mouth daily.   nitrofurantoin 50 MG capsule Commonly known as: MACRODANTIN Take 1 capsule by mouth at bedtime   QUEtiapine 25 MG tablet Commonly known as: SEROQUEL Take 25-50 mg by mouth at bedtime.   Vitamin D (Ergocalciferol) 1.25 MG (50000 UNIT) Caps  capsule Commonly known as: DRISDOL        Allergies:  Allergies  Allergen Reactions   Hydrocodone Itching and Nausea Only   Estradiol Other (See Comments)    Cream burned   Morphine And Codeine     Unsure of type of reaction    Family History: No family history on file.  Social History:  reports that she has never smoked. She has never used smokeless tobacco. She reports that she does not use drugs. No history on file for alcohol use.  ROS: All other review of systems were reviewed and are negative except what is noted above in HPI  Physical Exam: BP (!) 165/85   Pulse 78   Ht 5\' 2"  (1.575 m)   Wt 147 lb (66.7 kg)   BMI 26.89 kg/m   Constitutional:  Alert and oriented, No acute distress. HEENT: [place] AT, moist mucus membranes.  Trachea midline, no masses. Cardiovascular: No clubbing, cyanosis, or edema. Respiratory: Normal respiratory effort, no increased work of breathing. GI: Abdomen is soft, nontender, nondistended, no abdominal masses GU: No CVA tenderness.  Lymph: No cervical or inguinal lymphadenopathy. Skin: No rashes, bruises or suspicious lesions. Neurologic: Grossly intact, no focal deficits, moving all 4 extremities. Psychiatric: Normal mood and affect.  Laboratory Data: Lab Results  Component Value  Date   WBC 9.4 05/05/2021   HGB 12.3 05/05/2021   HCT 38.8 05/05/2021   MCV 87.2 05/05/2021   PLT 223 05/05/2021    Lab Results  Component Value Date   CREATININE 0.66 05/05/2021    No results found for: "PSA"  No results found for: "TESTOSTERONE"  No results found for: "HGBA1C"  Urinalysis    Component Value Date/Time   COLORURINE YELLOW 05/04/2021 1706   APPEARANCEUR Clear 12/26/2021 1518   LABSPEC 1.023 05/04/2021 1706   PHURINE 5.0 05/04/2021 1706   GLUCOSEU Negative 12/26/2021 1518   HGBUR SMALL (A) 05/04/2021 1706   BILIRUBINUR negative 08/16/2022 1048   BILIRUBINUR Negative 12/26/2021 1518   KETONESUR negative 08/16/2022 1048    KETONESUR NEGATIVE 05/04/2021 1706   PROTEINUR negative 08/16/2022 1048   PROTEINUR Negative 12/26/2021 1518   PROTEINUR NEGATIVE 05/04/2021 1706   UROBILINOGEN 0.2 08/16/2022 1048   UROBILINOGEN 0.2 04/15/2008 1444   NITRITE Negative 08/16/2022 1048   NITRITE Negative 12/26/2021 1518   NITRITE NEGATIVE 05/04/2021 1706   LEUKOCYTESUR Small (1+) (A) 08/16/2022 1048   LEUKOCYTESUR Trace (A) 12/26/2021 1518   LEUKOCYTESUR MODERATE (A) 05/04/2021 1706    Lab Results  Component Value Date   LABMICR See below: 12/26/2021   WBCUA 6-10 (A) 12/26/2021   LABEPIT 0-10 12/26/2021   MUCUS Present 12/26/2021   BACTERIA Few (A) 12/26/2021    Pertinent Imaging: *** No results found for this or any previous visit.  No results found for this or any previous visit.  No results found for this or any previous visit.  No results found for this or any previous visit.  No results found for this or any previous visit.  No valid procedures specified. No results found for this or any previous visit.  No results found for this or any previous visit.   Assessment & Plan:    1. OAB (overactive bladder) *** - Urinalysis, Routine w reflex microscopic  2. Recurrent UTI ***   No follow-ups on file.  Wilkie Aye, MD  [facility]

## 2023-05-29 ENCOUNTER — Encounter: Payer: Self-pay | Admitting: Urology

## 2023-05-29 NOTE — Patient Instructions (Signed)

## 2023-07-31 ENCOUNTER — Encounter (HOSPITAL_COMMUNITY): Payer: Self-pay | Admitting: Emergency Medicine

## 2023-07-31 ENCOUNTER — Emergency Department (HOSPITAL_COMMUNITY): Payer: Medicare Other

## 2023-07-31 ENCOUNTER — Other Ambulatory Visit: Payer: Self-pay

## 2023-07-31 ENCOUNTER — Observation Stay (HOSPITAL_COMMUNITY)
Admission: EM | Admit: 2023-07-31 | Discharge: 2023-08-01 | Disposition: A | Discharge status: Home / Self Care | Payer: Medicare Other | Source: Home / Self Care | Attending: Emergency Medicine | Admitting: Emergency Medicine

## 2023-07-31 DIAGNOSIS — R2689 Other abnormalities of gait and mobility: Secondary | ICD-10-CM | POA: Diagnosis not present

## 2023-07-31 DIAGNOSIS — N3000 Acute cystitis without hematuria: Secondary | ICD-10-CM | POA: Diagnosis not present

## 2023-07-31 DIAGNOSIS — Z79899 Other long term (current) drug therapy: Secondary | ICD-10-CM | POA: Diagnosis not present

## 2023-07-31 DIAGNOSIS — F99 Mental disorder, not otherwise specified: Secondary | ICD-10-CM | POA: Insufficient documentation

## 2023-07-31 DIAGNOSIS — N390 Urinary tract infection, site not specified: Secondary | ICD-10-CM | POA: Diagnosis present

## 2023-07-31 DIAGNOSIS — R5383 Other fatigue: Principal | ICD-10-CM | POA: Insufficient documentation

## 2023-07-31 DIAGNOSIS — M6281 Muscle weakness (generalized): Secondary | ICD-10-CM | POA: Diagnosis not present

## 2023-07-31 DIAGNOSIS — R2681 Unsteadiness on feet: Secondary | ICD-10-CM | POA: Diagnosis not present

## 2023-07-31 DIAGNOSIS — I10 Essential (primary) hypertension: Secondary | ICD-10-CM | POA: Insufficient documentation

## 2023-07-31 DIAGNOSIS — I639 Cerebral infarction, unspecified: Secondary | ICD-10-CM

## 2023-07-31 DIAGNOSIS — R531 Weakness: Secondary | ICD-10-CM

## 2023-07-31 LAB — URINALYSIS, ROUTINE W REFLEX MICROSCOPIC
Bilirubin Urine: NEGATIVE
Glucose, UA: NEGATIVE mg/dL
Hgb urine dipstick: NEGATIVE
Ketones, ur: NEGATIVE mg/dL
Nitrite: NEGATIVE
Protein, ur: NEGATIVE mg/dL
Specific Gravity, Urine: 1.016 (ref 1.005–1.030)
pH: 6 (ref 5.0–8.0)

## 2023-07-31 LAB — BASIC METABOLIC PANEL
Anion gap: 12 (ref 5–15)
BUN: 11 mg/dL (ref 8–23)
CO2: 27 mmol/L (ref 22–32)
Calcium: 9.5 mg/dL (ref 8.9–10.3)
Chloride: 100 mmol/L (ref 98–111)
Creatinine, Ser: 0.76 mg/dL (ref 0.44–1.00)
GFR, Estimated: >60 mL/min (ref >60)
Glucose, Bld: 115 mg/dL — ABNORMAL HIGH (ref 70–99)
Potassium: 4 mmol/L (ref 3.5–5.1)
Sodium: 139 mmol/L (ref 135–145)

## 2023-07-31 LAB — CBC
HCT: 45.5 % (ref 36.0–46.0)
Hemoglobin: 14 g/dL (ref 12.0–15.0)
MCH: 28.2 pg (ref 26.0–34.0)
MCHC: 30.8 g/dL (ref 30.0–36.0)
MCV: 91.5 fL (ref 80.0–100.0)
Platelets: 209 10*3/uL (ref 150–400)
RBC: 4.97 MIL/uL (ref 3.87–5.11)
RDW: 13.1 % (ref 11.5–15.5)
WBC: 12.8 10*3/uL — ABNORMAL HIGH (ref 4.0–10.5)
nRBC: 0 % (ref 0.0–0.2)

## 2023-07-31 MED ORDER — SODIUM CHLORIDE 0.9 % IV SOLN
1.0000 g | Freq: Once | INTRAVENOUS | Status: AC
  Administered 2023-07-31: 1 g via INTRAVENOUS
  Filled 2023-07-31: qty 10

## 2023-07-31 NOTE — ED Provider Notes (Addendum)
[place] EMERGENCY DEPARTMENT AT [facility] Provider Note   CSN: [id number] Arrival date & time: 07/31/23  1617     History  Chief Complaint  Patient presents with   Near Syncope    Rhonda Morales is a 72 y.o. female.  Patient with several day history of left leg weakness and falls.  Husband also states that her speech is difficult to understand she is having significant memory problems.  Patient states that she kind of feels off balance and wants to fall mostly to the left side.  Denies any injury or any pain from the falls.  Denies any upper respiratory symptoms denies any fevers denies any nausea vomiting or diarrhea.  Temp on arrival was 99.8 heart rate was originally 102 but now it is 81 blood pressure 148/77 respirations 20 oxygen saturation 93%.  Past medical history is significant for anxiety chronic pain hyperlipidemia interstitial cystitis in June 2022.  Patient has never used tobacco products.  Patient last seen at her UC August 16, 2022 for urinary frequency.  Patient has had a right knee replacement.  And patient has had a hysterectomy.  Patient is not on blood thinners.  In addition patient clearly denies any room spinning.       Home Medications Prior to Admission medications   Medication Sig Start Date End Date Taking? Authorizing Provider  amLODipine (NORVASC) 10 MG tablet Take 10 mg by mouth daily.   Yes [provider]  atenolol (TENORMIN) 25 MG tablet Take 1 tablet by mouth daily.   Yes [provider]  Biotin 10 MG TABS Take by mouth.   Yes [provider]  Buprenorphine HCl-Naloxone HCl 8-2 MG FILM Place 1 Film under the tongue in the morning and at bedtime.   Yes [provider]  Cholecalciferol (VITAMIN D3) 50 MCG (2000 UT) capsule Take 2,000 Units by mouth daily.   Yes [provider]  clobetasol (TEMOVATE) 0.05 % external solution Apply 1 Application topically 2 (two) times daily as needed  (inflammation).   Yes [provider]  cyclobenzaprine (FLEXERIL) 10 MG tablet Take 10 mg by mouth 3 (three) times daily as needed for muscle spasms.   Yes [provider]  gabapentin (NEURONTIN) 300 MG capsule Take 300 mg by mouth 3 (three) times daily.   Yes [provider]  metoprolol succinate (TOPROL-XL) 25 MG 24 hr tablet Take 1 tablet by mouth daily.   Yes [provider]  mirabegron ER (MYRBETRIQ) 25 MG TB24 tablet Take 1 tablet (25 mg total) by mouth daily. 05/23/23  Yes McKenzie, Mardene Celeste, MD  mirtazapine (REMERON) 30 MG tablet Take 30 mg by mouth at bedtime. 05/03/21  Yes [provider]  Multiple Vitamin (MULTIVITAMIN) tablet Take 1 tablet by mouth daily.   Yes [provider]  naproxen (NAPROSYN) 500 MG tablet Take 500 mg by mouth 2 (two) times daily with a meal.   Yes [provider]  neomycin-polymyxin-hydrocortisone (CORTISPORIN) 3.5-10000-1 OTIC suspension Place 3 drops into the left ear every 4 (four) hours. 05/05/23  Yes [provider]  nitrofurantoin (MACRODANTIN) 50 MG capsule Take 1 capsule (50 mg total) by mouth at bedtime. 05/23/23  Yes McKenzie, Mardene Celeste, MD  ondansetron (ZOFRAN) 4 MG tablet Take 4 mg by mouth 3 (three) times daily as needed for nausea or vomiting.   Yes [provider]  oxyCODONE-acetaminophen (PERCOCET) 7.5-325 MG tablet Take 1 tablet by mouth every 4 (four) hours as needed for moderate  pain. 01/14/20  Yes [provider]  Pumpkin Seed-Soy Germ (AZO BLADDER CONTROL/GO-LESS PO) Take by mouth.   Yes [provider]  QUEtiapine (SEROQUEL) 25 MG tablet Take 25-50 mg by mouth at bedtime. 10/21/21  Yes [provider]  vitamin B-12 (CYANOCOBALAMIN) 1000 MCG tablet Take 1 tablet (1,000 mcg total) by mouth daily. 05/06/21  Yes Erick Blinks, MD  Vitamin D, Ergocalciferol, (DRISDOL) 1.25 MG (50000 UNIT) CAPS capsule  09/24/19  Yes [provider]       Allergies    Hydrocodone, Atenolol, Estradiol, Morphine and codeine, and Penicillin g    Review of Systems   Review of Systems  Constitutional:  Negative for chills and fever.  HENT:  Negative for ear pain and sore throat.   Eyes:  Negative for pain and visual disturbance.  Respiratory:  Negative for cough and shortness of breath.   Cardiovascular:  Negative for chest pain and palpitations.  Gastrointestinal:  Negative for abdominal pain and vomiting.  Genitourinary:  Negative for dysuria and hematuria.  Musculoskeletal:  Negative for arthralgias and back pain.  Skin:  Negative for color change and rash.  Neurological:  Positive for dizziness, speech difficulty and weakness. Negative for seizures, syncope, facial asymmetry, numbness and headaches.  Psychiatric/Behavioral:  Positive for confusion.   All other systems reviewed and are negative.   Physical Exam Updated Vital Signs BP 137/77   Pulse 79   Temp 99.8 F (37.7 C) (Oral)   Resp 17   Ht 1.575 m (5\' 2" )   Wt 68 kg   SpO2 92%   BMI 27.44 kg/m  Physical Exam Vitals and nursing note reviewed.  Constitutional:      General: She is not in acute distress.    Appearance: Normal appearance. She is well-developed.  HENT:     Head: Normocephalic and atraumatic.     Mouth/Throat:     Mouth: Mucous membranes are moist.  Eyes:     Extraocular Movements: Extraocular movements intact.     Conjunctiva/sclera: Conjunctivae normal.     Pupils: Pupils are equal, round, and reactive to light.  Cardiovascular:     Rate and Rhythm: Normal rate and regular rhythm.     Heart sounds: No murmur heard. Pulmonary:     Effort: Pulmonary effort is normal. No respiratory distress.     Breath sounds: Normal breath sounds.  Abdominal:     Palpations: Abdomen is soft.     Tenderness: There is no abdominal tenderness.  Musculoskeletal:        General: No swelling.     Cervical back: Normal range of motion and neck supple.  Skin:     General: Skin is warm and dry.     Capillary Refill: Capillary refill takes less than 2 seconds.  Neurological:     Mental Status: She is alert and oriented to person, place, and time.     Cranial Nerves: No cranial nerve deficit.     Sensory: No sensory deficit.     Motor: Weakness present.     Comments: Left lower extremity weakness compared to right.  Currently no obvious speech abnormalities.  Psychiatric:        Mood and Affect: Mood normal.     ED Results / Procedures / Treatments   Labs (all labs ordered are listed, but only abnormal results are displayed) Labs Reviewed  BASIC METABOLIC PANEL - Abnormal; Notable for the following components:      Result Value   Glucose, Bld  115 (*)    All other components within normal limits  CBC - Abnormal; Notable for the following components:   WBC 12.8 (*)    All other components within normal limits  URINALYSIS, ROUTINE W REFLEX MICROSCOPIC - Abnormal; Notable for the following components:   APPearance CLOUDY (*)    Leukocytes,Ua MODERATE (*)    Bacteria, UA RARE (*)    Non Squamous Epithelial 0-5 (*)    All other components within normal limits  URINE CULTURE  CBG MONITORING, ED    EKG EKG Interpretation Date/Time:  Thursday July 31 2023 16:48:54 EDT Ventricular Rate:  103 PR Interval:  142 QRS Duration:  64 QT Interval:  320 QTC Calculation: 419 R Axis:   84  Text Interpretation: Sinus tachycardia T wave abnormality, consider inferior ischemia Abnormal ECG When compared with ECG of 15-Apr-2008 13:47, Vent. rate has increased BY  48 BPM Questionable change in QRS axis Non-specific change in ST segment in Anterior leads Nonspecific T wave abnormality now evident in Anterolateral leads New Confirmed by Vanetta Mulders [phone]) on 07/31/2023 8:56:28 PM  Radiology CT Head Wo Contrast  Result Date: 07/31/2023 CLINICAL DATA:  Neuro deficit, acute, stroke suspected EXAM: CT HEAD WITHOUT CONTRAST TECHNIQUE: Contiguous axial  images were obtained from the base of the skull through the vertex without intravenous contrast. RADIATION DOSE REDUCTION: This exam was performed according to the departmental dose-optimization program which includes automated exposure control, adjustment of the mA and/or kV according to patient size and/or use of iterative reconstruction technique. COMPARISON:  CT head 05/04/2021 FINDINGS: Brain: No evidence of large-territorial acute infarction. No parenchymal hemorrhage. No mass lesion. No extra-axial collection. No mass effect or midline shift. No hydrocephalus. Basilar cisterns are patent. Vascular: No hyperdense vessel. Skull: No acute fracture or focal lesion. Sinuses/Orbits: Bilateral sphenoid sinus mucosal thickening. Otherwise paranasal sinuses and mastoid air cells are clear. The orbits are unremarkable. Other: None. IMPRESSION: No acute intracranial abnormality. Electronically Signed   By: Tish Frederickson M.D.   On: 07/31/2023 22:01   DG Chest Port 1 View  Result Date: 07/31/2023 CLINICAL DATA:  Dizziness.  Weakness. EXAM: PORTABLE CHEST 1 VIEW COMPARISON:  Chest radiograph dated 04/15/2008. FINDINGS: No focal consolidation, pleural effusion, or pneumothorax. The cardiac silhouette is within limits. Atherosclerotic calcification of the aortic arch. No acute osseous pathology. IMPRESSION: No active disease. Electronically Signed   By: Elgie Collard M.D.   On: 07/31/2023 21:54    Procedures Procedures    Medications Ordered in ED Medications  cefTRIAXone (ROCEPHIN) 1 g in sodium chloride 0.9 % 100 mL IVPB (has no administration in time range)    ED Course/ Medical Decision Making/ A&P                                 Medical Decision Making Amount and/or Complexity of Data Reviewed Labs: ordered. Radiology: ordered.  Risk Decision regarding hospitalization.   Symptoms presentation a little concerning for maybe a stroke a few days ago.  Basic metabolic panel normal.  CBC mild  leukocytosis 12.8 hemoglobin is 14.0 platelets are normal.  Urine is cloudy with 21-50 RBCs and 21-50 white blood cells but bacteria is rare.  She is guarding for possible UTI but then patient has past history of interstitial cystitis and this may not be a true bacterial infection.  Urine has been sent for culture.  Will get chest x-ray will get CT head.  Patient  may very well require an admission for this left leg weakness.  And her feeling off balance and the speech problems that her husband has noted at home and the memory problems but no evidence of any speech problems currently.  Patient seems to be very oriented.  Will go ahead and treat for the possible UTI urine culture sent.  Will start Rocephin.  Patient still has weakness to the left lower extremity and states that she still feels as if she going to fall to the left.  Chest x-ray without acute findings head CT without any acute findings but feel that patient needs MRI to rule out CVA.  Will discuss with hospitalist for admission.  Chest x-ray had no active disease.   Final Clinical Impression(s) / ED Diagnoses Final diagnoses:  Weakness  Cerebrovascular accident (CVA), unspecified mechanism (HCC)  Acute cystitis without hematuria    Rx / DC Orders ED Discharge Orders     None         Vanetta Mulders, MD 07/31/23 2119    Vanetta Mulders, MD 07/31/23 [id number]

## 2023-07-31 NOTE — ED Triage Notes (Signed)
Pt presents for evaluation of dizziness and weakness, fell this am, denies striking head or LOC.

## 2023-07-31 NOTE — ED Notes (Signed)
Pt independently ambulated to and from the bathroom with her cane. No assistance required.   Pt states she wishes to go home

## 2023-08-01 ENCOUNTER — Observation Stay (HOSPITAL_COMMUNITY): Payer: Medicare Other

## 2023-08-01 ENCOUNTER — Encounter (HOSPITAL_COMMUNITY): Payer: Self-pay | Admitting: Family Medicine

## 2023-08-01 DIAGNOSIS — G459 Transient cerebral ischemic attack, unspecified: Secondary | ICD-10-CM

## 2023-08-01 DIAGNOSIS — R531 Weakness: Secondary | ICD-10-CM | POA: Diagnosis not present

## 2023-08-01 DIAGNOSIS — R5383 Other fatigue: Secondary | ICD-10-CM | POA: Diagnosis not present

## 2023-08-01 LAB — ECHOCARDIOGRAM COMPLETE BUBBLE STUDY
Area-P 1/2: 3.36 cm2
Calc EF: 68.4 %
Height: 62 in
MV VTI: 4.97 cm2
S' Lateral: 2.2 cm
Single Plane A2C EF: 72.9 %
Single Plane A4C EF: 64 %
Weight: 2400 oz

## 2023-08-01 LAB — CBC WITH DIFFERENTIAL/PLATELET
Abs Immature Granulocytes: 0.04 10*3/uL (ref 0.00–0.07)
Basophils Absolute: 0 10*3/uL (ref 0.0–0.1)
Basophils Relative: 0 %
Eosinophils Absolute: 0.3 10*3/uL (ref 0.0–0.5)
Eosinophils Relative: 3 %
HCT: 38.6 % (ref 36.0–46.0)
Hemoglobin: 12.1 g/dL (ref 12.0–15.0)
Immature Granulocytes: 1 %
Lymphocytes Relative: 27 %
Lymphs Abs: 2.2 10*3/uL (ref 0.7–4.0)
MCH: 28 pg (ref 26.0–34.0)
MCHC: 31.3 g/dL (ref 30.0–36.0)
MCV: 89.4 fL (ref 80.0–100.0)
Monocytes Absolute: 0.9 10*3/uL (ref 0.1–1.0)
Monocytes Relative: 12 %
Neutro Abs: 4.6 10*3/uL (ref 1.7–7.7)
Neutrophils Relative %: 57 %
Platelets: 161 10*3/uL (ref 150–400)
RBC: 4.32 MIL/uL (ref 3.87–5.11)
RDW: 13.2 % (ref 11.5–15.5)
WBC: 8.1 10*3/uL (ref 4.0–10.5)
nRBC: 0 % (ref 0.0–0.2)

## 2023-08-01 LAB — LIPID PANEL
Cholesterol: 172 mg/dL (ref 0–200)
HDL: 36 mg/dL — ABNORMAL LOW (ref >40)
LDL Cholesterol: 101 mg/dL — ABNORMAL HIGH (ref 0–99)
Total CHOL/HDL Ratio: 4.8 RATIO
Triglycerides: 177 mg/dL — ABNORMAL HIGH (ref <150)
VLDL: 35 mg/dL (ref 0–40)

## 2023-08-01 LAB — HEMOGLOBIN A1C
Hgb A1c MFr Bld: 5.7 % — ABNORMAL HIGH (ref 4.8–5.6)
Mean Plasma Glucose: 116.89 mg/dL

## 2023-08-01 LAB — COMPREHENSIVE METABOLIC PANEL
ALT: 70 U/L — ABNORMAL HIGH (ref 0–44)
AST: 129 U/L — ABNORMAL HIGH (ref 15–41)
Albumin: 3.5 g/dL (ref 3.5–5.0)
Alkaline Phosphatase: 121 U/L (ref 38–126)
Anion gap: 10 (ref 5–15)
BUN: 10 mg/dL (ref 8–23)
CO2: 27 mmol/L (ref 22–32)
Calcium: 8.7 mg/dL — ABNORMAL LOW (ref 8.9–10.3)
Chloride: 102 mmol/L (ref 98–111)
Creatinine, Ser: 0.62 mg/dL (ref 0.44–1.00)
GFR, Estimated: >60 mL/min (ref >60)
Glucose, Bld: 109 mg/dL — ABNORMAL HIGH (ref 70–99)
Potassium: 3.6 mmol/L (ref 3.5–5.1)
Sodium: 139 mmol/L (ref 135–145)
Total Bilirubin: 0.8 mg/dL (ref 0.3–1.2)
Total Protein: 6.9 g/dL (ref 6.5–8.1)

## 2023-08-01 LAB — HIV ANTIBODY (ROUTINE TESTING W REFLEX): HIV Screen 4th Generation wRfx: NONREACTIVE

## 2023-08-01 LAB — URINE CULTURE: Culture: >=100000 — AB

## 2023-08-01 LAB — MAGNESIUM: Magnesium: 1.9 mg/dL (ref 1.7–2.4)

## 2023-08-01 MED ORDER — ONDANSETRON HCL 4 MG PO TABS: 4.0000 mg | ORAL_TABLET | Freq: Four times a day (QID) | ORAL | Status: DC | PRN

## 2023-08-01 MED ORDER — CYCLOBENZAPRINE HCL 10 MG PO TABS
10.0000 mg | ORAL_TABLET | Freq: Three times a day (TID) | ORAL | Status: DC | PRN
  Administered 2023-08-01 (×2): 10 mg via ORAL
  Filled 2023-08-01 (×2): qty 1

## 2023-08-01 MED ORDER — QUETIAPINE FUMARATE 25 MG PO TABS: 25.0000 mg | ORAL_TABLET | Freq: Every day | ORAL | Status: DC

## 2023-08-01 MED ORDER — ACETAMINOPHEN 650 MG RE SUPP: 650.0000 mg | Freq: Four times a day (QID) | RECTAL | Status: DC | PRN

## 2023-08-01 MED ORDER — MIRTAZAPINE 15 MG PO TABS: 30.0000 mg | ORAL_TABLET | Freq: Every day | ORAL | Status: DC

## 2023-08-01 MED ORDER — HEPARIN SODIUM (PORCINE) 5000 UNIT/ML IJ SOLN
5000.0000 [IU] | Freq: Three times a day (TID) | INTRAMUSCULAR | Status: DC
  Administered 2023-08-01: 5000 [IU] via SUBCUTANEOUS
  Filled 2023-08-01: qty 1

## 2023-08-01 MED ORDER — OXYCODONE-ACETAMINOPHEN 7.5-325 MG PO TABS: 1.0000 | ORAL_TABLET | Freq: Three times a day (TID) | ORAL | Status: DC | PRN

## 2023-08-01 MED ORDER — GABAPENTIN 300 MG PO CAPS
300.0000 mg | ORAL_CAPSULE | Freq: Three times a day (TID) | ORAL | Status: DC
  Administered 2023-08-01: 300 mg via ORAL
  Filled 2023-08-01: qty 1

## 2023-08-01 MED ORDER — SODIUM CHLORIDE 0.9 % IV SOLN: 1.0000 g | INTRAVENOUS | Status: DC

## 2023-08-01 MED ORDER — STROKE: EARLY STAGES OF RECOVERY BOOK
Freq: Once | Status: DC
  Filled 2023-08-01: qty 1

## 2023-08-01 MED ORDER — ONDANSETRON HCL 4 MG/2ML IJ SOLN: 4.0000 mg | Freq: Four times a day (QID) | INTRAMUSCULAR | Status: DC | PRN

## 2023-08-01 MED ORDER — QUETIAPINE FUMARATE 25 MG PO TABS: 12.5000 mg | ORAL_TABLET | Freq: Every day | ORAL | Status: DC

## 2023-08-01 MED ORDER — OXYCODONE HCL 5 MG PO TABS
5.0000 mg | ORAL_TABLET | ORAL | Status: DC | PRN
  Administered 2023-08-01: 5 mg via ORAL
  Filled 2023-08-01: qty 1

## 2023-08-01 MED ORDER — ATENOLOL 25 MG PO TABS: 25.0000 mg | ORAL_TABLET | Freq: Every day | ORAL | Status: DC

## 2023-08-01 MED ORDER — GABAPENTIN 300 MG PO CAPS: 300.0000 mg | ORAL_CAPSULE | Freq: Two times a day (BID) | ORAL | Status: DC

## 2023-08-01 MED ORDER — ACETAMINOPHEN 325 MG PO TABS: 650.0000 mg | ORAL_TABLET | Freq: Four times a day (QID) | ORAL | Status: DC | PRN

## 2023-08-01 MED ORDER — AMLODIPINE BESYLATE 5 MG PO TABS: 10.0000 mg | ORAL_TABLET | Freq: Every day | ORAL | Status: DC

## 2023-08-01 MED ORDER — GABAPENTIN 100 MG PO CAPS
100.0000 mg | ORAL_CAPSULE | Freq: Three times a day (TID) | ORAL | Status: DC
  Administered 2023-08-01: 100 mg via ORAL
  Filled 2023-08-01: qty 1

## 2023-08-01 MED ORDER — MIRABEGRON ER 25 MG PO TB24
25.0000 mg | ORAL_TABLET | Freq: Every day | ORAL | Status: DC
  Administered 2023-08-01: 25 mg via ORAL
  Filled 2023-08-01: qty 1

## 2023-08-01 NOTE — ED Notes (Signed)
Pt called this RN to ask if she was being admitted for another night or if she can go home. Per pt she spoke to a provider last night who informed her she could go home today. Pt informed that we were wanting to keep her another night for observation. Pt states she wants to be d/c. MD notified.

## 2023-08-01 NOTE — Evaluation (Signed)
Physical Therapy Evaluation Patient Details Name: Rhonda Morales MRN: [id number] DOB: [birth year] Today's Date: 08/01/2023  History of Present Illness  Rhonda Morales is a 72 y.o. female with medical history significant of anxiety, hyperlipidemia, chronic pain, interstitial cystitis, presents the ED for a chief complaint of left-sided weakness.  Patient has some memory issues so her history is not complete.  She reports she has had left leg pain and weakness.  She goes back and forth between saying that the weakness is a separate thing that is causing her to fall down versus her leg being so painful that she cannot bear weight on it so that causes her to fall down.  Either way she is falling in off of her left leg.  Patient reports she has not hit her head and she has not had loss of consciousness.  She reports she is dizzy but she is always dizzy.  She does not think it has anything new.  She has had chest pain and dyspnea in the past, but she cannot say how long ago.  Recently she has had cough and dysuria.  Her review of systems is questionable given that she tries to answer yes to everything but then if you try to redirect her than she just automatically says no.  It is really difficult to tell what symptoms she is actually having.   Clinical Impression  Patient functioning near baseline for functional mobility and gait other than mild LLE weakness, but demonstrates good return for transferring to chair, to/from commode in bathroom and ambulating in room/hallway without loss of balance using her cane.  Patient tolerated sitting up in chair after therapy and encouraged to ambulate daily as tolerated with nursing staff.  Patient will benefit from continued skilled physical therapy in hospital and recommended venue below to increase strength, balance, endurance for safe ADLs and gait.           If plan is discharge home, recommend the following: A little help with walking and/or transfers;A little help  with bathing/dressing/bathroom;Assistance with cooking/housework;Help with stairs or ramp for entrance   Can travel by private vehicle        Equipment Recommendations BSC/3in1;None recommended by PT  Recommendations for Other Services       Functional Status Assessment Patient has had a recent decline in their functional status and demonstrates the ability to make significant improvements in function in a reasonable and predictable amount of time.     Precautions / Restrictions Precautions Precautions: None Restrictions Weight Bearing Restrictions: No      Mobility  Bed Mobility Overal bed mobility: Independent                  Transfers Overall transfer level: Independent                      Ambulation/Gait Ambulation/Gait assistance: Modified independent (Device/Increase time) Gait Distance (Feet): 100 Feet Assistive device: Straight cane Gait Pattern/deviations: Step-through pattern Gait velocity: slightly decreased     General Gait Details: grossly WFL with good return for ambulating in room, hallways without loss of balance  Stairs            Wheelchair Mobility     Tilt Bed    Modified Rankin (Stroke Patients Only)       Balance Overall balance assessment: No apparent balance deficits (not formally assessed)  Pertinent Vitals/Pain      Home Living Family/patient expects to be discharged to:: Private residence Living Arrangements: Spouse/significant other Available Help at Discharge: Available 24 hours/day Type of Home: House Home Access: Stairs to enter Entrance Stairs-Rails: Right;Left;Can reach both Entrance Stairs-Number of Steps: 3   Home Layout: One level Home Equipment: Cane - single point;BSC/3in1;Grab bars - tub/shower      Prior Function Prior Level of Function : Independent/Modified Independent             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, drives ADLs Comments: Independent     Extremity/Trunk Assessment   Upper Extremity Assessment Upper Extremity Assessment: Defer to OT evaluation    Lower Extremity Assessment Lower Extremity Assessment: Overall WFL for tasks assessed;LLE deficits/detail LLE Deficits / Details: grossly 4+/5 LLE Sensation: WNL LLE Coordination: WNL    Cervical / Trunk Assessment Cervical / Trunk Assessment: Normal  Communication   Communication Communication: No apparent difficulties  Cognition Arousal: Alert Behavior During Therapy: WFL for tasks assessed/performed Overall Cognitive Status: Within Functional Limits for tasks assessed                                          General Comments      Exercises     Assessment/Plan    PT Assessment All further PT needs can be met in the next venue of care  PT Problem List Decreased strength;Decreased activity tolerance;Decreased balance;Decreased mobility       PT Treatment Interventions      PT Goals (Current goals can be found in the Care Plan section)  Acute Rehab PT Goals Patient Stated Goal: return home with family to assist PT Goal Formulation: With patient Time For Goal Achievement: 08/01/23 Potential to Achieve Goals: Good    Frequency       Co-evaluation               AM-PAC PT "6 Clicks" Mobility  Outcome Measure Help needed turning from your back to your side while in a flat bed without using bedrails?: None Help needed moving from lying on your back to sitting on the side of a flat bed without using bedrails?: None Help needed moving to and from a bed to a chair (including a wheelchair)?: A Little Help needed standing up from a chair using your arms (e.g., wheelchair or bedside chair)?: None Help needed to walk in hospital room?: A Little Help needed climbing 3-5 steps with a railing? : A Little 6 Click Score: 21    End of Session   Activity Tolerance: Patient  tolerated treatment well Patient left: in chair;with call bell/phone within reach Nurse Communication: Mobility status PT Visit Diagnosis: Unsteadiness on feet (R26.81);Other abnormalities of gait and mobility (R26.89);Muscle weakness (generalized) (M62.81)    Time: 5277-8242 PT Time Calculation (min) (ACUTE ONLY): 28 min   Charges:   PT Evaluation $PT Eval Moderate Complexity: 1 Mod PT Treatments $Therapeutic Activity: 23-37 mins PT General Charges $$ ACUTE PT VISIT: 1 Visit         2:35 PM, 08/01/23 Ocie Bob, MPT Physical Therapist with [facility] [phone] office [phone] mobile phone

## 2023-08-01 NOTE — Discharge Summary (Signed)
DISCHARGE SUMMARY  Rhonda Morales  MR#: [id number]  DOB:1951-09-05  Date of Admission: 07/31/2023 Date of Discharge: 08/01/2023  Attending Physician:Najeh Credit Silvestre Gunner, MD  Patient's PCP:Kim, Fayrene Fearing, MD  Disposition: D/C home   Follow-up Appts:  Follow-up Information     Pearson Grippe, MD Follow up in 7 day(s).   Specialty: Internal Medicine Contact information: [address] [place] [address] [phone]                 Tests Needing Follow-up: -assess tolerance to and compliance with decrease in multiple medications advised at time of d/c (see meds below)  Discharge Diagnoses: Possible subacute left-sided weakness with transient slurring of speech/AMS Polypharmacy HTN Chronic pain HLD Chronic Right superior convexity meningioma  Initial presentation: 72 year old with a history of anxiety disorder, HLD, chronic pain, and interstitial cystitis who presented to the ER 8/29 with an inconsistent history including possible left leg weakness as well as pain. She was unable to clarify whether there was an abrupt onset of weakness or if it was simply pain that kept her from bearing weight, as she was lethargic and mumbling and rambling in her converstaion. Nonetheless she reported multiple falls and ambulatory dysfunction due to her left leg. Her husband reported possible slurring of speech and worsening "memory problems."   Hospital Course:  Possible subacute left-sided weakness with transient slurring of speech/AMS On my exam this is most c/w a toxic metabolic encephalopathy, likely due to polypharmacy - no evidence of acute CVA on CT or MRI - my suspicion for TIA was very low - did not feel CTa head/neck or dopplers indicated for now - no need for SLP eval  -CT head unrevealing  -MRI brain noted no acute intracranial abnormality/normal for age MRI brain -TTE without acute findings to suggest TIA/cardiac source of embolism  -LDL 101 -A1c 5.7 -PT/OT  reported patient was at her baseline level on function and able to provide for her own ADLs   Polypharmacy Home medication list includes multiple medications that could lead to confusion/altered sensorium to include buprenorphine, Flexeril, Neurontin, oxycodone, Seroquel, and Remeron -this is the most likely explanation of her presenting complaints in my opinion - I attempted to minimize as many of these medicines as possible while also avoiding acute withdrawal symptoms -over the course of her observation stay, in the absence of many of these medications, her mental status improved dramatically as good her functional level and she requested discharge home -with this improvement this was felt to be reasonable and safe   HTN BP stable at time of d/c - home BP meds minimized as pt was somewhat hypotensive at time of admit, likely due to sedation of polypharmacy as noted above    Chronic pain Continue flexeril and gabapentin but at lower dose - low dose oxy only if severe pain - decision of whether to cont buprenorphine to be left to her PCP/prescriber    HLD No change in tx plan    Chronic Right superior convexity meningioma Noted incidentally on MRI this admission and reportedly only minimally larger since 2011 with no edema or significant mass effect  Allergies as of 08/01/2023       Reactions   Hydrocodone Itching, Nausea Only   Atenolol Other (See Comments)   Estradiol Other (See Comments)   Cream burned   Morphine And Codeine    Unsure of type of reaction   Penicillin G Other (See Comments)  Medication List     STOP taking these medications    amLODipine 10 MG tablet Commonly known as: NORVASC   atenolol 25 MG tablet Commonly known as: TENORMIN   metoprolol succinate 25 MG 24 hr tablet Commonly known as: TOPROL-XL   naproxen 500 MG tablet Commonly known as: NAPROSYN       TAKE these medications    acetaminophen 325 MG tablet Commonly known as:  TYLENOL Take 2 tablets (650 mg total) by mouth every 6 (six) hours as needed for mild pain (or Fever >/= 101).   AZO BLADDER CONTROL/GO-LESS PO Take by mouth.   Biotin 10 MG Tabs Take by mouth.   Buprenorphine HCl-Naloxone HCl 8-2 MG Film Place 1 Film under the tongue in the morning and at bedtime.   clobetasol 0.05 % external solution Commonly known as: TEMOVATE Apply 1 Application topically 2 (two) times daily as needed (inflammation).   cyanocobalamin 1000 MCG tablet Commonly known as: VITAMIN B12 Take 1 tablet (1,000 mcg total) by mouth daily.   cyclobenzaprine 10 MG tablet Commonly known as: FLEXERIL Take 10 mg by mouth 3 (three) times daily as needed for muscle spasms.   gabapentin 300 MG capsule Commonly known as: NEURONTIN Take 1 capsule (300 mg total) by mouth 2 (two) times daily. What changed: when to take this   mirabegron ER 25 MG Tb24 tablet Commonly known as: MYRBETRIQ Take 1 tablet (25 mg total) by mouth daily.   mirtazapine 30 MG tablet Commonly known as: REMERON Take 30 mg by mouth at bedtime.   multivitamin tablet Take 1 tablet by mouth daily.   neomycin-polymyxin-hydrocortisone 3.5-10000-1 OTIC suspension Commonly known as: CORTISPORIN Place 3 drops into the left ear every 4 (four) hours.   nitrofurantoin 50 MG capsule Commonly known as: MACRODANTIN Take 1 capsule (50 mg total) by mouth at bedtime.   ondansetron 4 MG tablet Commonly known as: ZOFRAN Take 4 mg by mouth 3 (three) times daily as needed for nausea or vomiting.   oxyCODONE-acetaminophen 7.5-325 MG tablet Commonly known as: PERCOCET Take 1 tablet by mouth every 8 (eight) hours as needed for moderate pain. What changed: when to take this   QUEtiapine 25 MG tablet Commonly known as: SEROQUEL Take 0.5 tablets (12.5 mg total) by mouth at bedtime. What changed: how much to take   Vitamin D (Ergocalciferol) 1.25 MG (50000 UNIT) Caps capsule Commonly known as: DRISDOL   Vitamin  D3 50 MCG (2000 UT) capsule Take 2,000 Units by mouth daily.        Day of Discharge BP 110/66   Pulse 82   Temp 97.7 F (36.5 C) (Oral)   Resp 16   Ht 5\' 2"  (1.575 m)   Wt 68 kg   SpO2 93%   BMI 27.44 kg/m   Physical Exam: General: No acute respiratory distress Neck: No JVD or thyromegaly Lungs: Clear to auscultation bilaterally without wheezes or rhonchi, with good air movement throughout all fields Cardiovascular: Regular rate and rhythm without murmur gallop or rub normal S1 and S2 Abdomen: Nontender, nondistended, soft, bowel sounds present, no organomegaly, no rebound, no ascites Extremities: No significant cyanosis, clubbing, edema bilateral lower extremities Neurologic: Alert and oriented, cranial nerves II through XII intact bilaterally, 5 over 5 strength bilateral upper and lower studies, no Babinski, intact to sensation of touch throughout  Basic Metabolic Panel: Recent Labs  Lab 07/31/23 1703 08/01/23 0334  NA 139 139  K 4.0 3.6  CL 100 102  CO2 27 27  GLUCOSE 115* 109*  BUN 11 10  CREATININE 0.76 0.62  CALCIUM 9.5 8.7*  MG  --  1.9    CBC: Recent Labs  Lab 07/31/23 1703 08/01/23 0523  WBC 12.8* 8.1  NEUTROABS  --  4.6  HGB 14.0 12.1  HCT 45.5 38.6  MCV 91.5 89.4  PLT 209 161    Time spent in discharge (includes decision making & examination of pt): 35 minutes  08/01/2023, 3:03 PM   Lonia Blood, MD [facility] Office  [phone]

## 2023-08-01 NOTE — TOC CM/SW Note (Signed)
Transition of Care [facility]) - Inpatient Brief Assessment   Patient Details  Name: Rhonda Morales MRN: [id number] Date of Birth: 1951/02/25  Transition of Care [facility]) CM/SW Contact:    Villa Herb, LCSWA Phone Number: 08/01/2023, 12:02 PM   Clinical Narrative: Transition of Care Department [facility]) has reviewed patient and no TOC needs have been identified at this time. We will continue to monitor patient advancement through interdisciplinary progression rounds. If new patient transition needs arise, please place a TOC consult.  Transition of Care Asessment: Insurance and Status: Insurance coverage has been reviewed Patient has primary care physician: Yes Home environment has been reviewed: from home Prior level of function:: independent with cane Prior/Current Home Services: No current home services Social Determinants of Health Reivew: SDOH reviewed no interventions necessary Readmission risk has been reviewed: Yes Transition of care needs: no transition of care needs at this time

## 2023-08-01 NOTE — H&P (Signed)
History and Physical    Patient: Rhonda Morales ZOX:[id number] DOB: 12/21/1950 DOA: 07/31/2023 DOS: the patient was seen and examined on 08/01/2023 PCP: Pearson Grippe, MD  Patient coming from: Home  Chief Complaint:  Chief Complaint  Patient presents with   Near Syncope   HPI: Rhonda Morales is a 72 y.o. female with medical history significant of anxiety, hyperlipidemia, chronic pain, interstitial cystitis, presents the ED for a chief complaint of left-sided weakness.  Patient has some memory issues so her history is not complete.  She reports she has had left leg pain and weakness.  She goes back and forth between saying that the weakness is a separate thing that is causing her to fall down versus her leg being so painful that she cannot bear weight on it so that causes her to fall down.  Either way she is falling in off of her left leg.  Patient reports she has not hit her head and she has not had loss of consciousness.  She reports she is dizzy but she is always dizzy.  She does not think it has anything new.  She has had chest pain and dyspnea in the past, but she cannot say how long ago.  Recently she has had cough and dysuria.  Her review of systems is questionable given that she tries to answer yes to everything but then if you try to redirect her than she just automatically says no.  It is really difficult to tell what symptoms she is actually having.  Patient does not smoke and does not drink. Review of Systems: As mentioned in the history of present illness. All other systems reviewed and are negative. Past Medical History:  Diagnosis Date   Anxiety    Chronic pain    Hyperlipidemia 05/04/2021   Interstitial cystitis 05/04/2021   History reviewed. No pertinent surgical history. Social History:  reports that she has never smoked. She has never used smokeless tobacco. She reports that she does not use drugs. No history on file for alcohol use.  Allergies  Allergen Reactions   Hydrocodone  Itching and Nausea Only   Atenolol Other (See Comments)   Estradiol Other (See Comments)    Cream burned   Morphine And Codeine     Unsure of type of reaction   Penicillin G Other (See Comments)    History reviewed. No pertinent family history.  Prior to Admission medications   Medication Sig Start Date End Date Taking? Authorizing Provider  amLODipine (NORVASC) 10 MG tablet Take 10 mg by mouth daily.   Yes [provider]  atenolol (TENORMIN) 25 MG tablet Take 1 tablet by mouth daily.   Yes [provider]  Biotin 10 MG TABS Take by mouth.   Yes [provider]  Buprenorphine HCl-Naloxone HCl 8-2 MG FILM Place 1 Film under the tongue in the morning and at bedtime.   Yes [provider]  Cholecalciferol (VITAMIN D3) 50 MCG (2000 UT) capsule Take 2,000 Units by mouth daily.   Yes [provider]  clobetasol (TEMOVATE) 0.05 % external solution Apply 1 Application topically 2 (two) times daily as needed (inflammation).   Yes [provider]  cyclobenzaprine (FLEXERIL) 10 MG tablet Take 10 mg by mouth 3 (three) times daily as needed for muscle spasms.   Yes [provider]  gabapentin (NEURONTIN) 300 MG capsule Take 300 mg by mouth 3 (three) times daily.   Yes [provider]  metoprolol succinate (TOPROL-XL) 25 MG  24 hr tablet Take 1 tablet by mouth daily.   Yes [provider]  mirabegron ER (MYRBETRIQ) 25 MG TB24 tablet Take 1 tablet (25 mg total) by mouth daily. 05/23/23  Yes McKenzie, Mardene Celeste, MD  mirtazapine (REMERON) 30 MG tablet Take 30 mg by mouth at bedtime. 05/03/21  Yes [provider]  Multiple Vitamin (MULTIVITAMIN) tablet Take 1 tablet by mouth daily.   Yes [provider]  naproxen (NAPROSYN) 500 MG tablet Take 500 mg by mouth 2 (two) times daily with a meal.   Yes [provider]  neomycin-polymyxin-hydrocortisone (CORTISPORIN) 3.5-10000-1 OTIC suspension Place 3 drops  into the left ear every 4 (four) hours. 05/05/23  Yes [provider]  nitrofurantoin (MACRODANTIN) 50 MG capsule Take 1 capsule (50 mg total) by mouth at bedtime. 05/23/23  Yes McKenzie, Mardene Celeste, MD  ondansetron (ZOFRAN) 4 MG tablet Take 4 mg by mouth 3 (three) times daily as needed for nausea or vomiting.   Yes [provider]  oxyCODONE-acetaminophen (PERCOCET) 7.5-325 MG tablet Take 1 tablet by mouth every 4 (four) hours as needed for moderate pain. 01/14/20  Yes [provider]  Pumpkin Seed-Soy Germ (AZO BLADDER CONTROL/GO-LESS PO) Take by mouth.   Yes [provider]  QUEtiapine (SEROQUEL) 25 MG tablet Take 25-50 mg by mouth at bedtime. 10/21/21  Yes [provider]  vitamin B-12 (CYANOCOBALAMIN) 1000 MCG tablet Take 1 tablet (1,000 mcg total) by mouth daily. 05/06/21  Yes Erick Blinks, MD  Vitamin D, Ergocalciferol, (DRISDOL) 1.25 MG (50000 UNIT) CAPS capsule  09/24/19  Yes [provider]    Physical Exam: Vitals:   08/01/23 0230 08/01/23 0300 08/01/23 0500 08/01/23 0604  BP: 124/66 112/79 125/60   Pulse: 80 86 81   Resp: 15 17 18    Temp:    98.5 F (36.9 C)  TempSrc:    Oral  SpO2: 91% 96% 94%   Weight:      Height:       1.  General: Patient lying supine in bed,  no acute distress   2. Psychiatric: Alert and oriented x 3, mood and behavior normal for situation, pleasant and cooperative with exam   3. Neurologic: Weak in the left lower extremity compared to the right, upper extremities are equal bilaterally   4. HEENMT:  Head is atraumatic, normocephalic, pupils reactive to light, neck is supple, trachea is midline, mucous membranes are moist   5. Respiratory : Lungs are clear to auscultation bilaterally without wheezing, rhonchi, rales, no cyanosis, no increase in work of breathing or accessory muscle use   6. Cardiovascular : Heart rate normal, rhythm is regular, no murmurs, rubs or gallops, no peripheral edema,  peripheral pulses palpated   7. Gastrointestinal:  Abdomen is soft, nondistended, nontender to palpation bowel sounds active, no masses or organomegaly palpated   8. Skin:  Skin is warm, dry and intact without rashes, acute lesions, or ulcers on limited exam   9.Musculoskeletal:  No acute deformities or trauma, no asymmetry in tone, no peripheral edema, peripheral pulses palpated, no tenderness to palpation in the extremities  Data Reviewed: In the ED Temp 98.8, heart rate 75-102, respiratory rate 17-20, blood pressure 114/67-148/88, satting 90-96% Leukocytosis present with a white blood cell count of 12.8, hemoglobin 14.0 Chemistries unremarkable UA is indicative of UTI Urine culture pending CT head shows no acute intracranial abnormality Chest x-ray shows no acute disease EKG shows a heart rate of 103, sinus tachycardia, QTc 419  Assessment and Plan: CVA/TIA workup - Reported left-sided weakness - CT head is without acute intracranial abnormality - MRI in the a.m. - Echo in the a.m. - PT/OT eval/OT eval and check - Continue to monitor  Hypertension Continue Norvasc and atenolol  Psych disorder Continue Seroquel and Remeron  Chronic pain Continue Flexeril and gabapentin    Advance Care Planning:   Code Status: Full Code   Consults: Neurology  Family Communication:  Severity of Illness: The appropriate patient status for this patient is OBSERVATION. Observation status is judged to be reasonable and necessary in order to provide the required intensity of service to ensure the patient's safety. The patient's presenting symptoms, physical exam findings, and initial radiographic and laboratory data in the context of their medical condition is felt to place them at decreased risk for further clinical deterioration. Furthermore, it is anticipated that the patient will be medically stable for discharge from the hospital within 2 midnights of admission.   Author: Lilyan Gilford, DO 08/01/2023 6:56 AM  For on call review [email or web address].

## 2023-08-01 NOTE — Progress Notes (Signed)
  Echocardiogram 2D Echocardiogram has been performed.  Milda Smart 08/01/2023, 10:55 AM

## 2023-08-01 NOTE — Progress Notes (Signed)
OT Cancellation Note  Patient Details Name: Rhonda Morales MRN: [id number] DOB: 11/25/1951   Cancelled Treatment:    Reason Eval/Treat Not Completed: Patient at procedure or test/ unavailable. Will attempt to see pt later as time permits.   Deniah Saia OT, MOT   Danie Chandler 08/01/2023, 10:29 AM

## 2023-08-01 NOTE — Evaluation (Signed)
Occupational Therapy Evaluation Patient Details Name: Rhonda Morales MRN: [id number] DOB: [birth year] Today's Date: 08/01/2023   History of Present Illness Rhonda Morales is a 72 y.o. female with medical history significant of anxiety, hyperlipidemia, chronic pain, interstitial cystitis, presents the ED for a chief complaint of left-sided weakness.  Patient has some memory issues so her history is not complete.  She reports she has had left leg pain and weakness.  She goes back and forth between saying that the weakness is a separate thing that is causing her to fall down versus her leg being so painful that she cannot bear weight on it so that causes her to fall down.  Either way she is falling in off of her left leg.  Patient reports she has not hit her head and she has not had loss of consciousness.  She reports she is dizzy but she is always dizzy.  She does not think it has anything new.  She has had chest pain and dyspnea in the past, but she cannot say how long ago.  Recently she has had cough and dysuria.  Her review of systems is questionable given that she tries to answer yes to everything but then if you try to redirect her than she just automatically says no.  It is really difficult to tell what symptoms she is actually having.   Clinical Impression   Pt agreeable to Ot evaluation. Pt received sitting up in chair. Husband present throughout evaluation.  Pt reports that she was bathing dressing, and driving independently prior to hospitalization. Pt was able to complete sit to stand t/f independently and walk around room independently. Pt had cane in room but did not use it during session. Pt reports she feels safe to d/c home and will have 24/7 assist from husband if needed. Pt does not require any further OT services at this time.       If plan is discharge home, recommend the following:      Functional Status Assessment  Patient has had a recent decline in their functional status and  demonstrates the ability to make significant improvements in function in a reasonable and predictable amount of time.  Equipment Recommendations  None recommended by OT    Recommendations for Other Services       Precautions / Restrictions Precautions Precautions: Fall      Mobility Bed Mobility Overal bed mobility: Independent                  Transfers Overall transfer level: Independent                 General transfer comment: moving with ease      Balance Overall balance assessment: Independent                                         ADL either performed or assessed with clinical judgement   ADL Overall ADL's : Independent                                             Vision Baseline Vision/History: 0 No visual deficits Ability to See in Adequate Light: 0 Adequate Patient Visual Report: No change from baseline  Extremity/Trunk Assessment Upper Extremity Assessment Upper Extremity Assessment: Overall WFL for tasks assessed   Lower Extremity Assessment Lower Extremity Assessment: Defer to PT evaluation   Cervical / Trunk Assessment Cervical / Trunk Assessment: Normal   Communication Communication Communication: No apparent difficulties   Cognition Arousal: Alert Behavior During Therapy: WFL for tasks assessed/performed Overall Cognitive Status: Within Functional Limits for tasks assessed                                                        Home Living Family/patient expects to be discharged to:: Private residence Living Arrangements: Spouse/significant other Available Help at Discharge: Available 24 hours/day                                    Prior Functioning/Environment Prior Level of Function : Independent/Modified Independent             Mobility Comments: pt reports that she has been using a cane since yesterday ADLs Comments:  independent         AM-PAC OT "6 Clicks" Daily Activity     Outcome Measure Help from another person eating meals?: None Help from another person taking care of personal grooming?: None Help from another person toileting, which includes using toliet, bedpan, or urinal?: None Help from another person bathing (including washing, rinsing, drying)?: None Help from another person to put on and taking off regular upper body clothing?: None Help from another person to put on and taking off regular lower body clothing?: None 6 Click Score: 24   End of Session Nurse Communication: Mobility status  Activity Tolerance: Patient tolerated treatment well Patient left: in chair;with call bell/phone within reach;with family/visitor present                   Time: 1240-1250 OT Time Calculation (min): 10 min Charges:  OT General Charges $OT Visit: 1 Visit    Bevelyn Ngo, OTR/L 08/01/2023, 1:05 PM

## 2023-08-01 NOTE — Discharge Instructions (Signed)
The doctor at the hospital is concerned that you are on too many medications that can cause you to be sedated (sleepy). He has made changes to many of your medications, and these changes are noted in your discharge medication list. New prescriptions have not been written to avoid confusion, but instead you can use your old medications less frequently or at a lower dose, as spelled out in your discharge medication list, until your current supply runs out. You should see your primary care provider in 7-10 days to consider writing new prescriptions, and even decreasing some of these medications even more.

## 2023-08-01 NOTE — ED Notes (Signed)
Pt ambulatory to restroom with cane.

## 2023-08-02 ENCOUNTER — Telehealth (HOSPITAL_BASED_OUTPATIENT_CLINIC_OR_DEPARTMENT_OTHER): Payer: Self-pay | Admitting: *Deleted

## 2023-08-02 NOTE — Telephone Encounter (Signed)
Post ED Visit - Positive Culture Follow-up  Culture report reviewed by antimicrobial stewardship pharmacist: [facility] []  [address], Pharm.D. []  Celedonio Miyamoto, Pharm.D., BCPS AQ-ID []  Garvin Fila, Pharm.D., BCPS []  Georgina Pillion, Pharm.D., BCPS []  [place], [place].D., BCPS, AAHIVP []  Estella Husk, Pharm.D., BCPS, AAHIVP []  Lysle Pearl, PharmD, BCPS []  Phillips Climes, PharmD, BCPS []  Agapito Games, PharmD, BCPS []  Verlan Friends, PharmD []  Mervyn Gay, PharmD, BCPS [x]  Gillis Ends, PharmD  [facility] []  Len Childs, PharmD []  Greer Pickerel, PharmD []  Adalberto Cole, PharmD []  Perlie Gold, Rph []  Lonell Face) Jean Rosenthal, PharmD []  Earl Many, PharmD []  Junita Push, PharmD []  Dorna Leitz, PharmD []  Terrilee Files, PharmD []  Lynann Beaver, PharmD []  Keturah Barre, PharmD []  Loralee Pacas, PharmD []  Bernadene Person, PharmD   Positive urine culture Treated with Cephalexin, organism sensitive to the same and no further patient follow-up is required at this time.  Rhonda Morales 08/02/2023, 8:18 AM

## 2023-11-12 ENCOUNTER — Ambulatory Visit: Payer: Medicare Other | Admitting: Urology

## 2024-02-17 NOTE — Progress Notes (Unsigned)
 Name: Rhonda Morales DOB: 23-Nov-1951 MRN: [id number]  History of Present Illness: Rhonda Morales is a 73 y.o. female who presents today for follow up visit at [facility].  - GU history: 1. OAB with urinary frequency, urgency, and urge incontinence. 2. Recurrent UTIs. 3. Interstitial cystitis. 4. Vaginal atrophy. Did not tolerate topical vaginal estrogen cream; previously advised to use Replens cream 2-3x/week.   At last visit with Dr. Ronne Morales on 05/23/2023: The plan was: - Continue Macrodantin (Nitrofurantoin) 50 mg daily for UTI prophylaxis. - Continue Myrbetriq (Mirabegron) 25 mg daily for OAB.  Since last visit: > 07/31/2023: Urine culture positive; treated with Keflex.  Today: She reports increased urinary frequency, urgency, and urge incontinence over the past month. Prior to that she was not leaking routinely but now she is leaking at least a couple times per day and has had to change her clothing frequently over the past week or so. Denies caffeine intake.  She denies bladder pain, dysuria, gross hematuria, straining to void, or sensations of incomplete emptying.   Medications: Current Outpatient Medications  Medication Sig Dispense Refill   acetaminophen (TYLENOL) 325 MG tablet Take 2 tablets (650 mg total) by mouth every 6 (six) hours as needed for mild pain (or Fever >/= 101).     Biotin 10 MG TABS Take by mouth.     Buprenorphine HCl-Naloxone HCl 8-2 MG FILM Place 1 Film under the tongue in the morning and at bedtime.     cephALEXin (KEFLEX) 500 MG capsule Take 1 capsule (500 mg total) by mouth 2 (two) times daily for 7 days. 14 capsule 0   Cholecalciferol (VITAMIN D3) 50 MCG (2000 UT) capsule Take 2,000 Units by mouth daily.     clobetasol (TEMOVATE) 0.05 % external solution Apply 1 Application topically 2 (two) times daily as needed (inflammation).     cyclobenzaprine (FLEXERIL) 10 MG tablet Take 10 mg by mouth 3 (three) times daily as needed for  muscle spasms.     gabapentin (NEURONTIN) 300 MG capsule Take 1 capsule (300 mg total) by mouth 2 (two) times daily.     mirtazapine (REMERON) 30 MG tablet Take 30 mg by mouth at bedtime.     Multiple Vitamin (MULTIVITAMIN) tablet Take 1 tablet by mouth daily.     neomycin-polymyxin-hydrocortisone (CORTISPORIN) 3.5-10000-1 OTIC suspension Place 3 drops into the left ear every 4 (four) hours.     ondansetron (ZOFRAN) 4 MG tablet Take 4 mg by mouth 3 (three) times daily as needed for nausea or vomiting.     Pumpkin Seed-Soy Germ (AZO BLADDER CONTROL/GO-LESS PO) Take by mouth.     QUEtiapine (SEROQUEL) 25 MG tablet Take 0.5 tablets (12.5 mg total) by mouth at bedtime.     Vibegron (GEMTESA) 75 MG TABS Take 1 tablet (75 mg total) by mouth daily. 30 tablet 11   vitamin B-12 (CYANOCOBALAMIN) 1000 MCG tablet Take 1 tablet (1,000 mcg total) by mouth daily. 30 tablet 1   Vitamin D, Ergocalciferol, (DRISDOL) 1.25 MG (50000 UNIT) CAPS capsule      nitrofurantoin (MACRODANTIN) 50 MG capsule Take 1 capsule (50 mg total) by mouth at bedtime. 30 capsule 11   oxyCODONE-acetaminophen (PERCOCET) 7.5-325 MG tablet Take 1 tablet by mouth every 8 (eight) hours as needed for moderate pain. (Patient not taking: Reported on 02/18/2024)     No current facility-administered medications for this visit.    Allergies: Allergies  Allergen Reactions   Hydrocodone Itching and Nausea Only  Atenolol Other (See Comments)   Estradiol Other (See Comments)    Cream burned   Morphine And Codeine     Unsure of type of reaction   Penicillin G Other (See Comments)    Past Medical History:  Diagnosis Date   Anxiety    Chronic pain    Hyperlipidemia 05/04/2021   Interstitial cystitis 05/04/2021   History reviewed. No pertinent surgical history. History reviewed. No pertinent family history. Social History   Socioeconomic History   Marital status: Married    Spouse name: Not on file   Number of children: Not on file    Years of education: Not on file   Highest education level: Not on file  Occupational History   Not on file  Tobacco Use   Smoking status: Never   Smokeless tobacco: Never  Vaping Use   Vaping status: Never Used  Substance and Sexual Activity   Alcohol use: Not on file   Drug use: Never   Sexual activity: Not on file  Other Topics Concern   Not on file  Social History Narrative   Not on file   Social Drivers of Health   Financial Resource Strain: Not on file  Food Insecurity: No Food Insecurity (08/01/2023)   Hunger Vital Sign    Worried About Running Out of Food in the Last Year: Never true    Ran Out of Food in the Last Year: Never true  Transportation Needs: Unknown (08/01/2023)   PRAPARE - Administrator, Civil Service (Medical): Not on file    Lack of Transportation (Non-Medical): No  Physical Activity: Not on file  Stress: Not on file  Social Connections: Not on file  Intimate Partner Violence: Not At Risk (08/01/2023)   Humiliation, Afraid, Rape, and Kick questionnaire    Fear of Current or Ex-Partner: No    Emotionally Abused: No    Physically Abused: No    Sexually Abused: No    Review of Systems Constitutional: Patient denies any unintentional weight loss or change in strength lntegumentary: Patient denies any rashes or pruritus Cardiovascular: Patient denies chest pain or syncope Respiratory: Patient denies shortness of breath Gastrointestinal: Patient denies constipation Musculoskeletal: Patient denies muscle cramps or weakness Neurologic: Patient denies convulsions or seizures Allergic/Immunologic: Patient denies recent allergic reaction(s) Hematologic/Lymphatic: Patient denies bleeding tendencies Endocrine: Patient denies heat/cold intolerance  GU: As per HPI.  OBJECTIVE Vitals:   02/18/24 1137  BP: (!) 155/78  Pulse: 89   There is no height or weight on file to calculate BMI.  Physical Examination Constitutional: No obvious  distress; patient is non-toxic appearing  Cardiovascular: No visible lower extremity edema.  Respiratory: The patient does not have audible wheezing/stridor; respirations do not appear labored  Gastrointestinal: Abdomen non-distended Musculoskeletal: Normal ROM of UEs  Skin: No obvious rashes/open sores  Neurologic: CN 2-12 grossly intact Psychiatric: Answered questions appropriately with normal affect  Hematologic/Lymphatic/Immunologic: No obvious bruises or sites of spontaneous bleeding  Urine microscopy: >30 WBC/hpf, 3-10 RBC/hpf, few bacteria PVR: 0 ml  ASSESSMENT Urge incontinence - Plan: BLADDER SCAN AMB NON-IMAGING, Urinalysis, Routine w reflex microscopic, Urine Culture, Vibegron (GEMTESA) 75 MG TABS  OAB (overactive bladder) - Plan: Urine Culture, Vibegron (GEMTESA) 75 MG TABS  History of recurrent UTI (urinary tract infection) - Plan: nitrofurantoin (MACRODANTIN) 50 MG capsule, cephALEXin (KEFLEX) 500 MG capsule, Urine Culture  Interstitial cystitis - Plan: Urine Culture, Vibegron (GEMTESA) 75 MG TABS  Acute cystitis without hematuria - Plan: cephALEXin (KEFLEX) 500 MG  capsule, Urine Culture  Abnormal urinalysis - Plan: cephALEXin (KEFLEX) 500 MG capsule, Urine Culture  Abnormal UA. Will check urine culture and treat empirically with Keflex while awaiting culture results and sensitivities.  Will start Gemtesa as alternative to Myrbetriq (Mirabegron) 25 mg daily due to cost; samples provided & prescription sent. She previously use Myrbetriq PRN for travel; can use Gemtesa that way also if that works adequately for her.  We discussed importance of limiting Pyridium (phenazopyridine) use to 3 days consecutively due to risk for liver injury, methemoglobinemia, and damage to bone health.  Will plan for follow up in 6 months or sooner if needed. Pt verbalized understanding and agreement. All questions were answered.  PLAN Advised the following: 1. Urine culture. 2. Keflex  (Cephalexin) 500 mg 2x/day for 7 days.  3. Continue Macrodantin (Nitrofurantoin) 50 daily for UTI prophylaxis. 4. Gemtesa (Vibegron) 75 mg daily or PRN. 5. Return in about 6 months (around 08/20/2024) for UA, PVR, & f/u with Evette Georges NP.  Orders Placed This Encounter  Procedures   Urine Culture   Urinalysis, Routine w reflex microscopic   BLADDER SCAN AMB NON-IMAGING    It has been explained that the patient is to follow regularly with their PCP in addition to all other providers involved in their care and to follow instructions provided by these respective offices. Patient advised to contact urology clinic if any urologic-pertaining questions, concerns, new symptoms or problems arise in the interim period.  There are no Patient Instructions on file for this visit.  Electronically signed by:  Donnita Falls, FNP   02/18/24    1:18 PM

## 2024-02-18 ENCOUNTER — Ambulatory Visit (INDEPENDENT_AMBULATORY_CARE_PROVIDER_SITE_OTHER): Admitting: Urology

## 2024-02-18 ENCOUNTER — Encounter: Payer: Self-pay | Admitting: Urology

## 2024-02-18 VITALS — BP 155/78 | HR 89

## 2024-02-18 DIAGNOSIS — N3000 Acute cystitis without hematuria: Secondary | ICD-10-CM

## 2024-02-18 DIAGNOSIS — Z87440 Personal history of urinary (tract) infections: Secondary | ICD-10-CM

## 2024-02-18 DIAGNOSIS — R8290 Unspecified abnormal findings in urine: Secondary | ICD-10-CM

## 2024-02-18 DIAGNOSIS — N3941 Urge incontinence: Secondary | ICD-10-CM | POA: Diagnosis not present

## 2024-02-18 DIAGNOSIS — N3281 Overactive bladder: Secondary | ICD-10-CM | POA: Diagnosis not present

## 2024-02-18 DIAGNOSIS — N3010 Interstitial cystitis (chronic) without hematuria: Secondary | ICD-10-CM | POA: Diagnosis not present

## 2024-02-18 LAB — URINALYSIS, ROUTINE W REFLEX MICROSCOPIC
Bilirubin, UA: NEGATIVE
Glucose, UA: NEGATIVE
Ketones, UA: NEGATIVE
Nitrite, UA: NEGATIVE
Protein,UA: NEGATIVE
Specific Gravity, UA: 1.015 (ref 1.005–1.030)
Urobilinogen, Ur: 1 mg/dL (ref 0.2–1.0)
pH, UA: 6.5 (ref 5.0–7.5)

## 2024-02-18 LAB — MICROSCOPIC EXAMINATION: WBC, UA: >30 /HPF — AB (ref 0–5)

## 2024-02-18 MED ORDER — CEPHALEXIN 500 MG PO CAPS
500.0000 mg | ORAL_CAPSULE | Freq: Two times a day (BID) | ORAL | 0 refills | Status: AC
Start: 2024-02-18 — End: 2024-02-25

## 2024-02-18 MED ORDER — NITROFURANTOIN MACROCRYSTAL 50 MG PO CAPS
50.0000 mg | ORAL_CAPSULE | Freq: Every day | ORAL | 11 refills | Status: DC
Start: 2024-02-18 — End: 2024-09-21

## 2024-02-18 MED ORDER — GEMTESA 75 MG PO TABS
1.0000 | ORAL_TABLET | Freq: Every day | ORAL | 11 refills | Status: DC
Start: 2024-02-18 — End: 2024-06-21

## 2024-02-18 NOTE — Progress Notes (Signed)
 Bladder Scan completed today.  Patient can void prior to the bladder scan. Bladder scan result: 0  Performed By: Gwendolyn Grant T. CMA  Additional notes-

## 2024-02-20 ENCOUNTER — Telehealth: Payer: Self-pay

## 2024-02-20 NOTE — Telephone Encounter (Signed)
 Medication prior authorization request received.  Completed PA request through cover my meds for drug Gemtesa 75 MG. KEY: [id number]  Approved: Pending

## 2024-02-22 LAB — URINE CULTURE

## 2024-02-23 ENCOUNTER — Telehealth: Payer: Self-pay

## 2024-02-23 NOTE — Telephone Encounter (Signed)
-----   Message from Donnita Falls sent at 02/23/2024  1:48 PM EDT ----- Please see if tier exemption can be done ----- Message ----- From: Troy Sine, CMA Sent: 02/23/2024  11:09 AM EDT To: Donnita Falls, FNP  Please advised

## 2024-02-23 NOTE — Telephone Encounter (Signed)
 Tried calling patient with no answer, left vm for return call to office.

## 2024-02-23 NOTE — Telephone Encounter (Signed)
Open in error

## 2024-02-23 NOTE — Telephone Encounter (Signed)
 Patient made aware and voiced understanding.

## 2024-02-23 NOTE — Telephone Encounter (Signed)
-----   Message from Rhonda Morales sent at 02/22/2024  9:38 AM EDT ----- Please let pt know urine culture result. The Keflex which was prescribed should cover this pathogen. Thanks.

## 2024-03-01 ENCOUNTER — Telehealth: Payer: Self-pay

## 2024-03-01 NOTE — Telephone Encounter (Signed)
-----   Message from Nurse [facility] sent at 03/01/2024 10:00 AM EDT ----- May be duplicate

## 2024-03-01 NOTE — Telephone Encounter (Signed)
 Patient was made aware via voiced message approval of Gemtesa 75 mg.

## 2024-03-15 ENCOUNTER — Encounter (HOSPITAL_COMMUNITY): Payer: Self-pay | Admitting: Emergency Medicine

## 2024-03-15 ENCOUNTER — Other Ambulatory Visit: Payer: Self-pay

## 2024-03-15 ENCOUNTER — Emergency Department (HOSPITAL_COMMUNITY)
Admission: EM | Admit: 2024-03-15 | Discharge: 2024-03-15 | Disposition: A | Discharge status: Home / Self Care | Attending: Emergency Medicine | Admitting: Emergency Medicine

## 2024-03-15 ENCOUNTER — Emergency Department (HOSPITAL_COMMUNITY)

## 2024-03-15 DIAGNOSIS — M545 Low back pain, unspecified: Secondary | ICD-10-CM | POA: Diagnosis present

## 2024-03-15 DIAGNOSIS — M79604 Pain in right leg: Secondary | ICD-10-CM | POA: Diagnosis not present

## 2024-03-15 DIAGNOSIS — M79605 Pain in left leg: Secondary | ICD-10-CM | POA: Insufficient documentation

## 2024-03-15 DIAGNOSIS — M549 Dorsalgia, unspecified: Secondary | ICD-10-CM

## 2024-03-15 MED ORDER — LIDOCAINE 5 % EX PTCH: 1.0000 | MEDICATED_PATCH | CUTANEOUS | 0 refills | Status: DC

## 2024-03-15 MED ORDER — ACETAMINOPHEN 500 MG PO TABS
1000.0000 mg | ORAL_TABLET | Freq: Once | ORAL | Status: AC
  Administered 2024-03-15: 1000 mg via ORAL
  Filled 2024-03-15: qty 2

## 2024-03-15 MED ORDER — LIDOCAINE 5 % EX PTCH
1.0000 | MEDICATED_PATCH | CUTANEOUS | Status: DC
  Administered 2024-03-15: 1 via TRANSDERMAL
  Filled 2024-03-15: qty 1

## 2024-03-15 MED ORDER — ACETAMINOPHEN 500 MG PO TABS: 1000.0000 mg | ORAL_TABLET | Freq: Four times a day (QID) | ORAL | Status: DC | PRN

## 2024-03-15 NOTE — ED Notes (Signed)
 A&Ox4. Pt stated," I fell right on my face a couple days after my hip surgery. My back didn't hurt then."  Pt moving slowly during position changes without assistance. Pt has full ROM. Stated, "it's more comfortable when I lay flat." Pt denies recent injury.

## 2024-03-15 NOTE — ED Provider Notes (Signed)
 [place] EMERGENCY DEPARTMENT AT [facility] Provider Note   CSN: [id number] Arrival date & time: 03/15/24  1318     History  Chief Complaint  Patient presents with   Leg Pain    Rhonda Morales is a 73 y.o. female.  73 year old female with a history of chronic pain, chronic leg weakness (uses a walker), and interstitial cystitis who presents emergency department with bilateral leg pain.  Patient reports that for the past 2 days she has had worsening of her leg pain.  Describes it as a throbbing and burning sensation that starts at her lower back and radiates down both of her legs.  No bowel or bladder incontinence.  Does have some numbness and tingling of her feet.  No trauma to her back recently.  No history of IV drug use.  No fevers.  Does have a history of 2 back surgeries in the past.  Takes gabapentin and cyclobenzaprine for back pain.         Home Medications Prior to Admission medications   Medication Sig Start Date End Date Taking? Authorizing Provider  lidocaine (LIDODERM) 5 % Place 1 patch onto the skin daily. Remove & Discard patch within 12 hours or as directed by MD 03/15/24  Yes Rondel Baton, MD  acetaminophen (TYLENOL) 500 MG tablet Take 2 tablets (1,000 mg total) by mouth every 6 (six) hours as needed for mild pain (pain score 1-3) (or Fever >/= 101). 03/15/24   Rondel Baton, MD  Biotin 10 MG TABS Take by mouth.    [provider]  Buprenorphine HCl-Naloxone HCl 8-2 MG FILM Place 1 Film under the tongue in the morning and at bedtime.    [provider]  Cholecalciferol (VITAMIN D3) 50 MCG (2000 UT) capsule Take 2,000 Units by mouth daily.    [provider]  clobetasol (TEMOVATE) 0.05 % external solution Apply 1 Application topically 2 (two) times daily as needed (inflammation).    [provider]  cyclobenzaprine (FLEXERIL) 10 MG tablet Take 10 mg by mouth 3 (three) times daily as needed for muscle spasms.     [provider]  gabapentin (NEURONTIN) 300 MG capsule Take 1 capsule (300 mg total) by mouth 2 (two) times daily. 08/01/23   Lonia Blood, MD  mirtazapine (REMERON) 30 MG tablet Take 30 mg by mouth at bedtime. 05/03/21   [provider]  Multiple Vitamin (MULTIVITAMIN) tablet Take 1 tablet by mouth daily.    [provider]  neomycin-polymyxin-hydrocortisone (CORTISPORIN) 3.5-10000-1 OTIC suspension Place 3 drops into the left ear every 4 (four) hours. 05/05/23   [provider]  nitrofurantoin (MACRODANTIN) 50 MG capsule Take 1 capsule (50 mg total) by mouth at bedtime. 02/18/24   Donnita Falls, FNP  ondansetron (ZOFRAN) 4 MG tablet Take 4 mg by mouth 3 (three) times daily as needed for nausea or vomiting.    [provider]  oxyCODONE-acetaminophen (PERCOCET) 7.5-325 MG tablet Take 1 tablet by mouth every 8 (eight) hours as needed for moderate pain. Patient not taking: Reported on 02/18/2024 08/01/23   Lonia Blood, MD  Pumpkin Seed-Soy Germ (AZO BLADDER CONTROL/GO-LESS PO) Take by mouth.    [provider]  QUEtiapine (SEROQUEL) 25 MG tablet Take 0.5 tablets (12.5 mg total) by mouth at bedtime. 08/01/23   Lonia Blood, MD  Vibegron (GEMTESA) 75 MG TABS Take 1 tablet (75 mg total) by mouth daily. 02/18/24   Donnita Falls, FNP  vitamin B-12 (CYANOCOBALAMIN) 1000 MCG tablet Take 1 tablet (1,000 mcg total) by mouth daily. 05/06/21   Memon, Jehanzeb, MD  Vitamin D, Ergocalciferol, (DRISDOL) 1.25 MG (50000 UNIT) CAPS capsule  09/24/19   [provider]      Allergies    Hydrocodone, Atenolol, Estradiol, Morphine and codeine, and Penicillin g    Review of Systems   Review of Systems  Physical Exam Updated Vital Signs BP 115/64 (BP Location: Right Arm)   Pulse 65   Temp 98.8 F (37.1 C) (Temporal)   Resp 15   Ht 5\' 2"  (1.575 m)   Wt 68 kg   SpO2 97%   BMI 27.44 kg/m  Physical Exam Vitals and nursing note  reviewed.  Constitutional:      General: She is not in acute distress.    Appearance: She is well-developed.  HENT:     Head: Normocephalic and atraumatic.     Right Ear: External ear normal.     Left Ear: External ear normal.     Nose: Nose normal.  Eyes:     Extraocular Movements: Extraocular movements intact.     Conjunctiva/sclera: Conjunctivae normal.     Pupils: Pupils are equal, round, and reactive to light.  Pulmonary:     Effort: Pulmonary effort is normal. No respiratory distress.  Musculoskeletal:     Cervical back: Normal range of motion and neck supple.     Right lower leg: No edema.     Left lower leg: No edema.     Comments: DP pulses 2+ bilaterally.  Both feet appear warm and well-perfused.  No obvious deformities or tenderness palpation of bilateral hips, knees, and ankles.  No spinal midline TTP in cervical, thoracic, or lumbar spine. No stepoffs noted.   Motor: Muscle bulk and tone are normal. Strength is 5/5 in hip flexion, knee flexion and extension, ankle dorsiflexion and plantar flexion bilaterally. Full strength of great toe dorsiflexion bilaterally.  Sensory: Intact sensation to light touch in L2 though S1 dermatomes bilaterally.   Reflexes: Patellar 2+ bilaterally, Achilles 2+ bilaterally, no ankle clonus bilaterally  Skin:    General: Skin is warm and dry.  Neurological:     Mental Status: She is alert and oriented to person, place, and time. Mental status is at baseline.  Psychiatric:        Mood and Affect: Mood normal.     ED Results / Procedures / Treatments   Labs (all labs ordered are listed, but only abnormal results are displayed) Labs Reviewed - No data to display  EKG None  Radiology No results found.  Procedures Procedures    Medications Ordered in ED Medications  lidocaine (LIDODERM) 5 % 1 patch (1 patch Transdermal Patch Applied 03/15/24 1426)  acetaminophen (TYLENOL) tablet 1,000 mg (1,000 mg Oral Given 03/15/24 1425)     ED Course/ Medical Decision Making/ A&P                                 Medical Decision Making Amount and/or Complexity of Data Reviewed Radiology: ordered.  Risk OTC drugs. Prescription drug management.   Rhonda Morales is a 73 y.o. female with comorbidities that complicate the patient evaluation including chronic pain, chronic leg weakness (uses a walker), and interstitial cystitis who presents emergency department with bilateral leg pain.   Initial Ddx:  Lumbar radiculopathy, spinal cord compression, pathologic fracture, spinal epidural abscess, spinal epidural hematoma  MDM:  Feel the patient likely has lumbar radiculopathy given their symptoms.  No signs or symptoms of cord compression such as bowel or bladder incontinence or weakness that would warrant MRI.  Given her age and no recent imaging we will obtain x-ray at this time.  No risk factors for spinal epidural abscess or spinal epidural hematoma either.  Plan:  Lumbar spine x-ray Tylenol Lidocaine patch  ED Summary/Re-evaluation:  Patient signed out to the oncoming physician awaiting imaging results.  Lidocaine Tylenol given.  Will have her follow-up with her primary doctor, spine, and her pain clinic about her back pain if imaging negative.  This patient presents to the ED for concern of complaints listed in HPI, this involves an extensive number of treatment options, and is a complaint that carries with it a high risk of complications and morbidity. Disposition including potential need for admission considered.   Dispo: Pending remainder of workup  Additional history obtained from spouse Records reviewed Outpatient Clinic Notes I have reviewed the patients home medications and made adjustments as needed Social Determinants of health:  Geriatric   Final Clinical Impression(s) / ED Diagnoses Final diagnoses:  Acute bilateral back pain, unspecified back location    Rx / DC Orders ED Discharge Orders           Ordered    acetaminophen (TYLENOL) 500 MG tablet  Every 6 hours PRN        03/15/24 1604    lidocaine (LIDODERM) 5 %  Every 24 hours        03/15/24 1604              Ninetta Basket, MD 03/15/24 1654

## 2024-03-15 NOTE — Discharge Instructions (Signed)
 You were seen for your back pain in the emergency department.   At home, please use Tylenol and lidocaine patches for pain.  Please also continue taking your gabapentin and Flexeril as well as the other medications that you are prescribed from her pain clinic.  Follow-up with your primary doctor in 2-3 days regarding your visit.  Follow-up with your pain management doctor and spine doctors soon as possible.   Return immediately to the emergency department if you experience any of the following: Numbness or weakness of your legs, bowel or bladder incontinence, numbness while wiping after pooping or urinating, or any other concerning symptoms.    Thank you for visiting our Emergency Department. It was a pleasure taking care of you today.

## 2024-03-15 NOTE — ED Notes (Signed)
 Pt transported to CT ?

## 2024-03-15 NOTE — ED Provider Notes (Signed)
 5:34 PM Patient signed out to me by previous ED physician. Pt is a 73 yo female presenting for low back pain with radiation to bilateral legs. No red flag symptoms. Normal neuro exam.   Xray lumbar pending.   Physical Exam  BP 115/64 (BP Location: Right Arm)   Pulse 65   Temp 98.8 F (37.1 C) (Temporal)   Resp 15   Ht 5\' 2"  (1.575 m)   Wt 68 kg   SpO2 97%   BMI 27.44 kg/m   Physical Exam  Procedures  Procedures  ED Course / MDM    Medical Decision Making Amount and/or Complexity of Data Reviewed Radiology: ordered.  Risk OTC drugs. Prescription drug management.   Lumbar x-ray demonstrates:  Mild progression of multilevel lumbar spondylosis since the remote  prior study. No acute osseous findings or malalignment.      On reevaluation the patient her symptoms have improved significantly after Lidoderm pain patch.  She is currently on a pain contract so I will continue to recommend gabapentin, muscle relaxers, and Lidoderm patches.  Otherwise no signs or symptoms of cauda equina syndrome or acute spinal cord pathology.  Safe to follow-up with PCP and pain clinic.  Patient in no distress and overall condition improved here in the ED. Detailed discussions were had with the patient regarding current findings, and need for close f/u with PCP or on call doctor. The patient has been instructed to return immediately if the symptoms worsen in any way for re-evaluation. Patient verbalized understanding and is in agreement with current care plan. All questions answered prior to discharge.         Quinn Bucco, DO 03/15/24 1734

## 2024-03-15 NOTE — ED Triage Notes (Signed)
 Pt c/o bilateral leg pain that started 2 days ago. Denies any injury

## 2024-03-16 ENCOUNTER — Emergency Department (HOSPITAL_COMMUNITY)
Admission: EM | Admit: 2024-03-16 | Discharge: 2024-03-16 | Disposition: A | Discharge status: Home / Self Care | Source: Home / Self Care | Attending: Emergency Medicine | Admitting: Emergency Medicine

## 2024-03-16 ENCOUNTER — Encounter (HOSPITAL_COMMUNITY): Payer: Self-pay | Admitting: Emergency Medicine

## 2024-03-16 ENCOUNTER — Other Ambulatory Visit: Payer: Self-pay

## 2024-03-16 DIAGNOSIS — M5441 Lumbago with sciatica, right side: Secondary | ICD-10-CM

## 2024-03-16 DIAGNOSIS — M545 Low back pain, unspecified: Secondary | ICD-10-CM | POA: Diagnosis not present

## 2024-03-16 MED ORDER — HYDROMORPHONE HCL 1 MG/ML IJ SOLN
2.0000 mg | Freq: Once | INTRAMUSCULAR | Status: AC
  Administered 2024-03-16: 2 mg via INTRAMUSCULAR
  Filled 2024-03-16: qty 2

## 2024-03-16 MED ORDER — OXYCODONE-ACETAMINOPHEN 5-325 MG PO TABS: 1.0000 | ORAL_TABLET | Freq: Four times a day (QID) | ORAL | 0 refills | Status: DC | PRN

## 2024-03-16 NOTE — ED Triage Notes (Signed)
 Pt c/o bilateral leg pain and lower back pain. Pt was seen earlier for the same and states it is not any better. Pt states she cannot take the pain.

## 2024-03-16 NOTE — ED Provider Notes (Signed)
 [place] EMERGENCY DEPARTMENT AT [facility] Provider Note   CSN: [id number] Arrival date & time: 03/15/24  2329     History  Chief Complaint  Patient presents with   Leg Pain    Rhonda Morales is a 73 y.o. female.  Patient is a 73 year old female with past medical history of degenerative disc disease status post back surgery in January.  Patient presenting today with complaints of low back pain.  This began in the absence of any injury or trauma.  She was initially doing well after her surgery, however reports falling at some point in the past few weeks and pain has been worse since.  Patient here earlier today, but is having no relief with recommendations made at that time.  She denies bowel or bladder complaints.  She does have some radiation of pain into both legs.       Home Medications Prior to Admission medications   Medication Sig Start Date End Date Taking? Authorizing Provider  acetaminophen (TYLENOL) 500 MG tablet Take 2 tablets (1,000 mg total) by mouth every 6 (six) hours as needed for mild pain (pain score 1-3) (or Fever >/= 101). 03/15/24   Rondel Baton, MD  Biotin 10 MG TABS Take by mouth.    [provider]  Buprenorphine HCl-Naloxone HCl 8-2 MG FILM Place 1 Film under the tongue in the morning and at bedtime.    [provider]  Cholecalciferol (VITAMIN D3) 50 MCG (2000 UT) capsule Take 2,000 Units by mouth daily.    [provider]  clobetasol (TEMOVATE) 0.05 % external solution Apply 1 Application topically 2 (two) times daily as needed (inflammation).    [provider]  cyclobenzaprine (FLEXERIL) 10 MG tablet Take 10 mg by mouth 3 (three) times daily as needed for muscle spasms.    [provider]  gabapentin (NEURONTIN) 300 MG capsule Take 1 capsule (300 mg total) by mouth 2 (two) times daily. 08/01/23   Lonia Blood, MD  lidocaine (LIDODERM) 5 % Place 1 patch onto the skin daily. Remove &  Discard patch within 12 hours or as directed by MD 03/15/24   Rondel Baton, MD  mirtazapine (REMERON) 30 MG tablet Take 30 mg by mouth at bedtime. 05/03/21   [provider]  Multiple Vitamin (MULTIVITAMIN) tablet Take 1 tablet by mouth daily.    [provider]  neomycin-polymyxin-hydrocortisone (CORTISPORIN) 3.5-10000-1 OTIC suspension Place 3 drops into the left ear every 4 (four) hours. 05/05/23   [provider]  nitrofurantoin (MACRODANTIN) 50 MG capsule Take 1 capsule (50 mg total) by mouth at bedtime. 02/18/24   Donnita Falls, FNP  ondansetron (ZOFRAN) 4 MG tablet Take 4 mg by mouth 3 (three) times daily as needed for nausea or vomiting.    [provider]  oxyCODONE-acetaminophen (PERCOCET) 7.5-325 MG tablet Take 1 tablet by mouth every 8 (eight) hours as needed for moderate pain. Patient not taking: Reported on 02/18/2024 08/01/23   Lonia Blood, MD  Pumpkin Seed-Soy Germ (AZO BLADDER CONTROL/GO-LESS PO) Take by mouth.    [provider]  QUEtiapine (SEROQUEL) 25 MG tablet Take 0.5 tablets (12.5 mg total) by mouth at bedtime. 08/01/23   Lonia Blood, MD  Vibegron (GEMTESA) 75 MG TABS Take 1 tablet (75 mg total) by mouth daily. 02/18/24   Donnita Falls, FNP  vitamin B-12 (CYANOCOBALAMIN) 1000 MCG tablet Take 1 tablet (1,000 mcg total) by mouth daily. 05/06/21   Memon,  Durward Mallard, MD  Vitamin D, Ergocalciferol, (DRISDOL) 1.25 MG (50000 UNIT) CAPS capsule  09/24/19   [provider]      Allergies    Hydrocodone, Atenolol, Estradiol, Morphine and codeine, and Penicillin g    Review of Systems   Review of Systems  All other systems reviewed and are negative.   Physical Exam Updated Vital Signs BP (!) 165/94 (BP Location: Right Arm)   Pulse 84   Temp 99 F (37.2 C) (Oral)   Resp 17   Ht 5\' 2"  (1.575 m)   Wt 68 kg   SpO2 96%   BMI 27.42 kg/m  Physical Exam Vitals and nursing note reviewed.  Constitutional:       General: She is not in acute distress.    Appearance: She is well-developed. She is not diaphoretic.  HENT:     Head: Normocephalic and atraumatic.  Cardiovascular:     Rate and Rhythm: Normal rate and regular rhythm.     Heart sounds: No murmur heard.    No friction rub. No gallop.  Pulmonary:     Effort: Pulmonary effort is normal. No respiratory distress.     Breath sounds: Normal breath sounds. No wheezing.  Abdominal:     General: Bowel sounds are normal. There is no distension.     Palpations: Abdomen is soft.     Tenderness: There is no abdominal tenderness.  Musculoskeletal:        General: Normal range of motion.     Cervical back: Normal range of motion and neck supple.     Comments: There is tenderness to palpation in the soft tissues of the lower lumbar region.  No bony tenderness or step-off.  Skin:    General: Skin is warm and dry.  Neurological:     General: No focal deficit present.     Mental Status: She is alert and oriented to person, place, and time.     Comments: Strength is 5 out of 5 in both lower extremities.     ED Results / Procedures / Treatments   Labs (all labs ordered are listed, but only abnormal results are displayed) Labs Reviewed - No data to display  EKG None  Radiology DG Lumbar Spine Complete Result Date: 03/15/2024 CLINICAL DATA:  Low back and bilateral leg pain for 2 days. No known injury. EXAM: LUMBAR SPINE - COMPLETE 4+ VIEW COMPARISON:  Lumbar spine radiographs 12/03/2008. FINDINGS: There are 5 lumbar type vertebral bodies. The alignment is stable and near anatomic. There is multilevel spondylosis which has mildly progressed from the remote prior study with increased disc space narrowing at L1-2 and L4-5. There is chronic Schmorl's node formation in the superior endplate of L1. No evidence of acute fracture or pars defect. Multilevel facet arthropathy noted. Interval left total hip arthroplasty. IMPRESSION: Mild progression of multilevel  lumbar spondylosis since the remote prior study. No acute osseous findings or malalignment. Electronically Signed   By: Carey Bullocks M.D.   On: 03/15/2024 17:08    Procedures Procedures    Medications Ordered in ED Medications  HYDROmorphone (DILAUDID) injection 2 mg (has no administration in time range)    ED Course/ Medical Decision Making/ A&P  Patient is a 73 year old female presenting with low back pain as described in the HPI.  Her x-rays from earlier today were reviewed and showed no acute process.  Patient is neurologically intact and has no red flags that would suggest an emergent situation.  I feel as though  it is reasonable to treat her with a short course of oxycodone.  The [place]  database was reviewed and patient has not had an opioid prescription filled since January.  Final Clinical Impression(s) / ED Diagnoses Final diagnoses:  None    Rx / DC Orders ED Discharge Orders     None         Orvilla Blander, MD 03/16/24 [phone]

## 2024-03-16 NOTE — ED Notes (Addendum)
 Pt ambulated with assistance of walker to ED treatment room with steady gait. Pt on locked stretcher in lowest position with call bell in reach screaming & yelling loudly. Pt repositioned into more erect sitting position of comfort. Pt states the pain is running down bilateral legs with the left leg hurting worse.

## 2024-05-06 ENCOUNTER — Other Ambulatory Visit (HOSPITAL_COMMUNITY): Payer: Self-pay | Admitting: Adult Health

## 2024-05-06 DIAGNOSIS — Z1231 Encounter for screening mammogram for malignant neoplasm of breast: Secondary | ICD-10-CM

## 2024-05-06 DIAGNOSIS — M810 Age-related osteoporosis without current pathological fracture: Secondary | ICD-10-CM

## 2024-05-13 ENCOUNTER — Ambulatory Visit (HOSPITAL_COMMUNITY)
Admission: RE | Admit: 2024-05-13 | Discharge: 2024-05-13 | Disposition: A | Discharge status: Home / Self Care | Source: Ambulatory Visit | Attending: Adult Health | Admitting: Adult Health

## 2024-05-13 DIAGNOSIS — M810 Age-related osteoporosis without current pathological fracture: Secondary | ICD-10-CM

## 2024-05-13 DIAGNOSIS — Z1231 Encounter for screening mammogram for malignant neoplasm of breast: Secondary | ICD-10-CM | POA: Insufficient documentation

## 2024-06-03 DIAGNOSIS — H903 Sensorineural hearing loss, bilateral: Secondary | ICD-10-CM | POA: Insufficient documentation

## 2024-06-20 ENCOUNTER — Encounter (HOSPITAL_COMMUNITY): Payer: Self-pay | Admitting: *Deleted

## 2024-06-20 ENCOUNTER — Emergency Department (HOSPITAL_COMMUNITY)

## 2024-06-20 ENCOUNTER — Other Ambulatory Visit: Payer: Self-pay

## 2024-06-20 ENCOUNTER — Observation Stay (HOSPITAL_COMMUNITY)
Admission: EM | Admit: 2024-06-20 | Discharge: 2024-06-21 | Disposition: A | Discharge status: Home / Self Care | Attending: Family Medicine | Admitting: Family Medicine

## 2024-06-20 DIAGNOSIS — E785 Hyperlipidemia, unspecified: Secondary | ICD-10-CM | POA: Insufficient documentation

## 2024-06-20 DIAGNOSIS — K30 Functional dyspepsia: Secondary | ICD-10-CM

## 2024-06-20 DIAGNOSIS — M79601 Pain in right arm: Secondary | ICD-10-CM | POA: Diagnosis present

## 2024-06-20 DIAGNOSIS — I110 Hypertensive heart disease with heart failure: Secondary | ICD-10-CM | POA: Insufficient documentation

## 2024-06-20 DIAGNOSIS — K2970 Gastritis, unspecified, without bleeding: Secondary | ICD-10-CM

## 2024-06-20 DIAGNOSIS — Z79899 Other long term (current) drug therapy: Secondary | ICD-10-CM | POA: Insufficient documentation

## 2024-06-20 DIAGNOSIS — I5032 Chronic diastolic (congestive) heart failure: Secondary | ICD-10-CM | POA: Insufficient documentation

## 2024-06-20 DIAGNOSIS — R0789 Other chest pain: Secondary | ICD-10-CM | POA: Diagnosis present

## 2024-06-20 DIAGNOSIS — G894 Chronic pain syndrome: Secondary | ICD-10-CM | POA: Insufficient documentation

## 2024-06-20 DIAGNOSIS — R079 Chest pain, unspecified: Secondary | ICD-10-CM | POA: Diagnosis present

## 2024-06-20 DIAGNOSIS — F418 Other specified anxiety disorders: Secondary | ICD-10-CM | POA: Diagnosis not present

## 2024-06-20 LAB — HEPATIC FUNCTION PANEL
ALT: 13 U/L (ref 0–44)
AST: 19 U/L (ref 15–41)
Albumin: 3.3 g/dL — ABNORMAL LOW (ref 3.5–5.0)
Alkaline Phosphatase: 77 U/L (ref 38–126)
Bilirubin, Direct: <0.1 mg/dL (ref 0.0–0.2)
Total Bilirubin: 0.4 mg/dL (ref 0.0–1.2)
Total Protein: 6.8 g/dL (ref 6.5–8.1)

## 2024-06-20 LAB — CBC
HCT: 46.2 % — ABNORMAL HIGH (ref 36.0–46.0)
Hemoglobin: 14.8 g/dL (ref 12.0–15.0)
MCH: 27.9 pg (ref 26.0–34.0)
MCHC: 32 g/dL (ref 30.0–36.0)
MCV: 87.2 fL (ref 80.0–100.0)
Platelets: 189 K/uL (ref 150–400)
RBC: 5.3 MIL/uL — ABNORMAL HIGH (ref 3.87–5.11)
RDW: 13 % (ref 11.5–15.5)
WBC: 8.6 K/uL (ref 4.0–10.5)
nRBC: 0 % (ref 0.0–0.2)

## 2024-06-20 LAB — TROPONIN I (HIGH SENSITIVITY)
Troponin I (High Sensitivity): <2 ng/L (ref <18)
Troponin I (High Sensitivity): <2 ng/L (ref <18)
Troponin I (High Sensitivity): <2 ng/L (ref <18)

## 2024-06-20 LAB — BASIC METABOLIC PANEL WITH GFR
Anion gap: 10 (ref 5–15)
BUN: 15 mg/dL (ref 8–23)
CO2: 25 mmol/L (ref 22–32)
Calcium: 9.1 mg/dL (ref 8.9–10.3)
Chloride: 103 mmol/L (ref 98–111)
Creatinine, Ser: 0.69 mg/dL (ref 0.44–1.00)
GFR, Estimated: >60 mL/min (ref >60)
Glucose, Bld: 101 mg/dL — ABNORMAL HIGH (ref 70–99)
Potassium: 4.1 mmol/L (ref 3.5–5.1)
Sodium: 138 mmol/L (ref 135–145)

## 2024-06-20 MED ORDER — ASPIRIN 325 MG PO TABS
325.0000 mg | ORAL_TABLET | Freq: Once | ORAL | Status: AC
  Administered 2024-06-20: 325 mg via ORAL
  Filled 2024-06-20: qty 1

## 2024-06-20 MED ORDER — NITROGLYCERIN 0.4 MG SL SUBL
0.4000 mg | SUBLINGUAL_TABLET | Freq: Once | SUBLINGUAL | Status: AC
  Administered 2024-06-20: 0.4 mg via SUBLINGUAL

## 2024-06-20 MED ORDER — NITROGLYCERIN 0.4 MG SL SUBL
0.4000 mg | SUBLINGUAL_TABLET | Freq: Once | SUBLINGUAL | Status: AC
  Administered 2024-06-20: 0.4 mg via SUBLINGUAL
  Filled 2024-06-20: qty 1

## 2024-06-20 MED ORDER — PANTOPRAZOLE SODIUM 40 MG IV SOLR
40.0000 mg | Freq: Once | INTRAVENOUS | Status: AC
  Administered 2024-06-20: 40 mg via INTRAVENOUS
  Filled 2024-06-20: qty 10

## 2024-06-20 MED ORDER — ACETAMINOPHEN 325 MG PO TABS
650.0000 mg | ORAL_TABLET | Freq: Once | ORAL | Status: AC
  Administered 2024-06-20: 650 mg via ORAL
  Filled 2024-06-20: qty 2

## 2024-06-20 NOTE — ED Provider Notes (Signed)
 [place] EMERGENCY DEPARTMENT AT [facility] Provider Note   CSN: [id number] Arrival date & time: 06/20/24  1535     Patient presents with: Chest Pain   Rhonda Morales is a 73 y.o. female.  {Add pertinent medical, surgical, social history, OB history to YEP:67052} Patient is Hyperlipidemia.  She complains of chest pressure off and on today with pain radiating into her right arm.   Chest Pain      Prior to Admission medications   Medication Sig Start Date End Date Taking? Authorizing Provider  acetaminophen  (TYLENOL ) 500 MG tablet Take 2 tablets (1,000 mg total) by mouth every 6 (six) hours as needed for mild pain (pain score 1-3) (or Fever >/= 101). 03/15/24   Yolande Lamar BROCKS, MD  Biotin 10 MG TABS Take by mouth.    [provider]  Buprenorphine HCl-Naloxone HCl 8-2 MG FILM Place 1 Film under the tongue in the morning and at bedtime.    [provider]  Cholecalciferol (VITAMIN D3) 50 MCG (2000 UT) capsule Take 2,000 Units by mouth daily.    [provider]  clobetasol (TEMOVATE) 0.05 % external solution Apply 1 Application topically 2 (two) times daily as needed (inflammation).    [provider]  cyclobenzaprine  (FLEXERIL ) 10 MG tablet Take 10 mg by mouth 3 (three) times daily as needed for muscle spasms.    [provider]  gabapentin  (NEURONTIN ) 300 MG capsule Take 1 capsule (300 mg total) by mouth 2 (two) times daily. 08/01/23   Danton Reyes DASEN, MD  lidocaine  (LIDODERM ) 5 % Place 1 patch onto the skin daily. Remove & Discard patch within 12 hours or as directed by MD 03/15/24   Yolande Lamar BROCKS, MD  mirtazapine  (REMERON ) 30 MG tablet Take 30 mg by mouth at bedtime. 05/03/21   [provider]  Multiple Vitamin (MULTIVITAMIN) tablet Take 1 tablet by mouth daily.    [provider]  neomycin-polymyxin-hydrocortisone (CORTISPORIN) 3.5-10000-1 OTIC suspension Place 3 drops into the left ear every 4 (four)  hours. 05/05/23   [provider]  nitrofurantoin  (MACRODANTIN ) 50 MG capsule Take 1 capsule (50 mg total) by mouth at bedtime. 02/18/24   Larocco, Sarah C, FNP  ondansetron  (ZOFRAN ) 4 MG tablet Take 4 mg by mouth 3 (three) times daily as needed for nausea or vomiting.    [provider]  oxyCODONE -acetaminophen  (PERCOCET) 5-325 MG tablet Take 1-2 tablets by mouth every 6 (six) hours as needed. 03/16/24   Geroldine Berg, MD  Pumpkin Seed-Soy Germ (AZO BLADDER CONTROL/GO-LESS PO) Take by mouth.    [provider]  QUEtiapine  (SEROQUEL ) 25 MG tablet Take 0.5 tablets (12.5 mg total) by mouth at bedtime. 08/01/23   Danton Reyes DASEN, MD  Vibegron  (GEMTESA ) 75 MG TABS Take 1 tablet (75 mg total) by mouth daily. 02/18/24   Gerldine Lauraine BROCKS, FNP  vitamin B-12 (CYANOCOBALAMIN ) 1000 MCG tablet Take 1 tablet (1,000 mcg total) by mouth daily. 05/06/21   Memon, Jehanzeb, MD  Vitamin D, Ergocalciferol, (DRISDOL) 1.25 MG (50000 UNIT) CAPS capsule  09/24/19   [provider]    Allergies: Hydrocodone, Atenolol , Estradiol, Morphine  and codeine, and Penicillin g    Review of Systems  Cardiovascular:  Positive for chest pain.    Updated Vital Signs BP 136/64   Pulse 62   Temp 98.3 F (36.8 C) (Oral)   Resp 12   Ht 5' 2 (1.575 m)   Wt 70.8 kg   SpO2 95%  BMI 28.53 kg/m   Physical Exam  (all labs ordered are listed, but only abnormal results are displayed) Labs Reviewed  CBC - Abnormal; Notable for the following components:      Result Value   RBC 5.30 (*)    HCT 46.2 (*)    All other components within normal limits  BASIC METABOLIC PANEL WITH GFR - Abnormal; Notable for the following components:   Glucose, Bld 101 (*)    All other components within normal limits  HEPATIC FUNCTION PANEL - Abnormal; Notable for the following components:   Albumin 3.3 (*)    All other components within normal limits  TROPONIN I (HIGH SENSITIVITY)  TROPONIN I (HIGH SENSITIVITY)   TROPONIN I (HIGH SENSITIVITY)    EKG: EKG Interpretation Date/Time:  Sunday June 20 2024 15:44:08 EDT Ventricular Rate:  70 PR Interval:  178 QRS Duration:  72 QT Interval:  408 QTC Calculation: 440 R Axis:   5  Text Interpretation: Normal sinus rhythm Cannot rule out Anterior infarct , age undetermined Abnormal ECG When compared with ECG of 31-Jul-2023 16:48, Questionable change in QRS axis Non-specific change in ST segment in Inferior leads T wave inversion no longer evident in Inferior leads Nonspecific T wave abnormality no longer evident in Anterolateral leads Confirmed by Suzette Pac [phone]) on 06/20/2024 3:48:54 PM  Radiology: [facility] Chest 2 View Result Date: 06/20/2024 CLINICAL DATA:  Chest pain and shortness of breath EXAM: CHEST - 2 VIEW COMPARISON:  07/31/2023. older exams as well FINDINGS: Underinflation. No pneumothorax, effusion or edema. There is some mild bandlike opacity left lung base. Atelectasis is favored. Overlapping cardiac leads. Degenerative changes along the spine. Surgical clips in the upper abdomen. IMPRESSION: Underinflation. Bandlike mild opacity left lung base. Atelectasis is favored. Electronically Signed   By: Ranell Bring M.D.   On: 06/20/2024 17:28    {Document cardiac monitor, telemetry assessment procedure when appropriate:32947} Procedures   Medications Ordered in the ED  pantoprazole  (PROTONIX ) injection 40 mg (40 mg Intravenous Given 06/20/24 1622)  nitroGLYCERIN  (NITROSTAT ) SL tablet 0.4 mg (0.4 mg Sublingual Given 06/20/24 1622)  aspirin  tablet 325 mg (325 mg Oral Given 06/20/24 1720)  nitroGLYCERIN  (NITROSTAT ) SL tablet 0.4 mg (0.4 mg Sublingual Given 06/20/24 1729)  acetaminophen  (TYLENOL ) tablet 650 mg (650 mg Oral Given 06/20/24 2003)  CRITICAL CARE Performed by: Pac Suzette Total critical care time: 45 minutes Critical care time was exclusive of separately billable procedures and treating other patients. Critical care was necessary to treat  or prevent imminent or life-threatening deterioration. Critical care was time spent personally by me on the following activities: development of treatment plan with patient and/or surrogate as well as nursing, discussions with consultants, evaluation of patient's response to treatment, examination of patient, obtaining history from patient or surrogate, ordering and performing treatments and interventions, ordering and review of laboratory studies, ordering and review of radiographic studies, pulse oximetry and re-evaluation of patient's condition.  Patient received 2 nitroglycerin  that eventually took the pain away.  First EKG shows some minimal ST depression inferiorly.  The second EKG did not show any depression.  2 troponins were negative.  Patient will be admitted to medicine for chest pain and probably see cardiology tomorrow   {Click here for ABCD2, HEART and other calculators REFRESH Note before signing:1}                              Medical Decision Making Amount and/or Complexity  of Data Reviewed Labs: ordered. Radiology: ordered.  Risk OTC drugs. Prescription drug management. Decision regarding hospitalization.   Atypical chest pain that has been relieved by nitroglycerin   {Document critical care time when appropriate  Document review of labs and clinical decision tools ie CHADS2VASC2, etc  Document your independent review of radiology images and any outside records  Document your discussion with family members, caretakers and with consultants  Document social determinants of health affecting pt's care  Document your decision making why or why not admission, treatments were needed:32947:::1}   Final diagnoses:  Atypical chest pain    ED Discharge Orders     None

## 2024-06-20 NOTE — H&P (Incomplete)
 History and Physical  [name] FMW:[id number] DOB: 02-04-1951 DOA: 06/20/2024  Referring physician: Dr. Suzette, EDP  PCP: Luke Agent, MD (Inactive)  Outpatient Specialists: None reported. Patient coming from: Home.  Chief Complaint: Left sided chest pain  HPI: Rhonda Morales is a 73 y.o. female with medical history significant for chronic anxiety/depression, hyperlipidemia, hypertension, chronic pain, chronic HFpEF, who presents to the ER due to nonexertional left-sided chest pain with onset yesterday.  The pain is sharp and reproducible on palpation, very tender, no rash present.  Denies subjective chills or fevers.  Denies chest trauma.  In the ER, high-sensitivity troponin negative x 2.  No evidence of acute ischemia on twelve-lead EKG.  She received sublingual nitroglycerin  in the ER with mild improvement of her chest pain.  Admitted by [facility], hospitalist service, for chest pain rule out ACS.  ED Course: Temperature 97.7.  BP 129/84, pulse 72, respiratory 18, O2 saturation 94% on room air.  Review of Systems: Review of systems as noted in the HPI. All other systems reviewed and are negative.   Past Medical History:  Diagnosis Date   Anxiety    Chronic pain    Hyperlipidemia 05/04/2021   Interstitial cystitis 05/04/2021   Past Surgical History:  Procedure Laterality Date   TOTAL HIP ARTHROPLASTY Left     Social History:  reports that she has never smoked. She has never used smokeless tobacco. She reports that she does not currently use alcohol. She reports that she does not use drugs.   Allergies  Allergen Reactions   Hydrocodone Itching and Nausea Only   Atenolol  Other (See Comments)   Estradiol Other (See Comments)    Cream burned   Morphine  And Codeine     Unsure of type of reaction   Penicillin G Other (See Comments)    Family history: None reported.  Prior to Admission medications   Medication Sig Start Date End Date Taking? Authorizing Provider   acetaminophen  (TYLENOL ) 500 MG tablet Take 2 tablets (1,000 mg total) by mouth every 6 (six) hours as needed for mild pain (pain score 1-3) (or Fever >/= 101). 03/15/24   Yolande Lamar BROCKS, MD  Biotin 10 MG TABS Take by mouth.    [provider]  Buprenorphine HCl-Naloxone HCl 8-2 MG FILM Place 1 Film under the tongue in the morning and at bedtime.    [provider]  Cholecalciferol (VITAMIN D3) 50 MCG (2000 UT) capsule Take 2,000 Units by mouth daily.    [provider]  clobetasol (TEMOVATE) 0.05 % external solution Apply 1 Application topically 2 (two) times daily as needed (inflammation).    [provider]  cyclobenzaprine  (FLEXERIL ) 10 MG tablet Take 10 mg by mouth 3 (three) times daily as needed for muscle spasms.    [provider]  gabapentin  (NEURONTIN ) 300 MG capsule Take 1 capsule (300 mg total) by mouth 2 (two) times daily. 08/01/23   Danton Reyes DASEN, MD  lidocaine  (LIDODERM ) 5 % Place 1 patch onto the skin daily. Remove & Discard patch within 12 hours or as directed by MD 03/15/24   Yolande Lamar BROCKS, MD  mirtazapine  (REMERON ) 30 MG tablet Take 30 mg by mouth at bedtime. 05/03/21   [provider]  Multiple Vitamin (MULTIVITAMIN) tablet Take 1 tablet by mouth daily.    [provider]  neomycin-polymyxin-hydrocortisone (CORTISPORIN) 3.5-10000-1 OTIC suspension Place 3 drops into the left ear every 4 (four) hours. 05/05/23   [provider]  nitrofurantoin  (MACRODANTIN )  50 MG capsule Take 1 capsule (50 mg total) by mouth at bedtime. 02/18/24   Larocco, Sarah C, FNP  ondansetron  (ZOFRAN ) 4 MG tablet Take 4 mg by mouth 3 (three) times daily as needed for nausea or vomiting.    [provider]  oxyCODONE -acetaminophen  (PERCOCET) 5-325 MG tablet Take 1-2 tablets by mouth every 6 (six) hours as needed. 03/16/24   Geroldine Berg, MD  Pumpkin Seed-Soy Germ (AZO BLADDER CONTROL/GO-LESS PO) Take by mouth.    [provider]  QUEtiapine  (SEROQUEL ) 25 MG tablet Take 0.5 tablets (12.5 mg total) by mouth at bedtime. 08/01/23   Danton Reyes DASEN, MD  Vibegron  (GEMTESA ) 75 MG TABS Take 1 tablet (75 mg total) by mouth daily. 02/18/24   Gerldine Lauraine BROCKS, FNP  vitamin B-12 (CYANOCOBALAMIN ) 1000 MCG tablet Take 1 tablet (1,000 mcg total) by mouth daily. 05/06/21   Memon, Jehanzeb, MD  Vitamin D, Ergocalciferol, (DRISDOL) 1.25 MG (50000 UNIT) CAPS capsule  09/24/19   [provider]    Physical Exam: BP 136/64   Pulse 62   Temp 98.3 F (36.8 C) (Oral)   Resp 12   Ht 5' 2 (1.575 m)   Wt 70.8 kg   SpO2 95%   BMI 28.53 kg/m   General: 73 y.o. year-old female well developed well nourished in no acute distress.  Alert and oriented x3. Cardiovascular: Regular rate and rhythm with no rubs or gallops.  No thyromegaly or JVD noted.  No lower extremity edema. 2/4 pulses in all 4 extremities.  Left chest wall very tender with minimal palpation. Respiratory: Clear to auscultation with no wheezes or rales. Good inspiratory effort. Abdomen: Soft nontender nondistended with normal bowel sounds x4 quadrants. Muskuloskeletal: No cyanosis, clubbing or edema noted bilaterally Neuro: CN II-XII intact, strength, sensation, reflexes Skin: No ulcerative lesions noted or rashes Psychiatry: Judgement and insight appear normal. Mood is appropriate for condition and setting          Labs on Admission:  Basic Metabolic Panel: Recent Labs  Lab 06/20/24 1719  NA 138  K 4.1  CL 103  CO2 25  GLUCOSE 101*  BUN 15  CREATININE 0.69  CALCIUM  9.1   Liver Function Tests: Recent Labs  Lab 06/20/24 1719  AST 19  ALT 13  ALKPHOS 77  BILITOT 0.4  PROT 6.8  ALBUMIN 3.3*   No results for input(s): LIPASE, AMYLASE in the last 168 hours. No results for input(s): AMMONIA in the last 168 hours. CBC: Recent Labs  Lab 06/20/24 1612  WBC 8.6  HGB 14.8  HCT 46.2*  MCV 87.2  PLT 189   Cardiac Enzymes: No  results for input(s): CKTOTAL, CKMB, CKMBINDEX, TROPONINI in the last 168 hours.  BNP (last 3 results) No results for input(s): BNP in the last 8760 hours.  ProBNP (last 3 results) No results for input(s): PROBNP in the last 8760 hours.  CBG: No results for input(s): GLUCAP in the last 168 hours.  Radiological Exams on Admission: DG Chest 2 View Result Date: 06/20/2024 CLINICAL DATA:  Chest pain and shortness of breath EXAM: CHEST - 2 VIEW COMPARISON:  07/31/2023. older exams as well FINDINGS: Underinflation. No pneumothorax, effusion or edema. There is some mild bandlike opacity left lung base. Atelectasis is favored. Overlapping cardiac leads. Degenerative changes along the spine. Surgical clips in the upper abdomen. IMPRESSION: Underinflation. Bandlike mild opacity left lung base. Atelectasis is favored. Electronically Signed   By: Ranell Bring M.D.   On: 06/20/2024  17:28    EKG: I independently viewed the EKG done and my findings are as followed: Sinus rhythm rate of 71.  Nonspecific ST-T changes.  QTc 452.  Assessment/Plan Present on Admission:  Chest pain  Principal Problem:   Chest pain  Atypical chest pain Nonexertional, reproducible on palpation and very tender High-sensitivity troponin negative x 2 Twelve-lead EKG with no evidence of acute ischemia Continue to monitor on telemetry Follow 2D echo As needed analgesics BP control  Chronic HFpEF Last 2D echo done on 08/01/2023 revealed LVEF 65 to 70% with grade 1 diastolic dysfunction Monitor strict I's and O's and daily weight Euvolemic on exam  Hyperlipidemia Resume home regimen after home meds are reconciled.  Chronic pain syndrome On chronic opiates Resume home regimen after home meds are reconciled. Add MiraLAX    Time: 75 minutes.   DVT prophylaxis: Subcu Lovenox  daily  Code Status: Full code.  Family Communication: None at bedside.  Disposition Plan: Admitted to telemetry  unit.  Consults called: None.  Admission status: Observation status.   Status is: Observation    Terry LOISE Hurst MD [facility] Pager [phone]  If 7PM-7AM, please contact night-coverage [email or web address] Password [email or web address]  06/20/2024, 9:36 PM

## 2024-06-20 NOTE — ED Triage Notes (Signed)
 Pt with mid CP since yesterday.  Radiates down rt arm. + dizziness + SOB denies N/V

## 2024-06-21 ENCOUNTER — Other Ambulatory Visit (HOSPITAL_COMMUNITY): Payer: Self-pay | Admitting: *Deleted

## 2024-06-21 ENCOUNTER — Other Ambulatory Visit (HOSPITAL_COMMUNITY)

## 2024-06-21 DIAGNOSIS — E785 Hyperlipidemia, unspecified: Secondary | ICD-10-CM | POA: Diagnosis not present

## 2024-06-21 DIAGNOSIS — G894 Chronic pain syndrome: Secondary | ICD-10-CM | POA: Diagnosis not present

## 2024-06-21 DIAGNOSIS — R079 Chest pain, unspecified: Secondary | ICD-10-CM | POA: Diagnosis not present

## 2024-06-21 DIAGNOSIS — R0789 Other chest pain: Secondary | ICD-10-CM | POA: Diagnosis not present

## 2024-06-21 DIAGNOSIS — I110 Hypertensive heart disease with heart failure: Secondary | ICD-10-CM | POA: Diagnosis not present

## 2024-06-21 LAB — BASIC METABOLIC PANEL WITH GFR
Anion gap: 10 (ref 5–15)
BUN: 13 mg/dL (ref 8–23)
CO2: 25 mmol/L (ref 22–32)
Calcium: 8.6 mg/dL — ABNORMAL LOW (ref 8.9–10.3)
Chloride: 101 mmol/L (ref 98–111)
Creatinine, Ser: 0.65 mg/dL (ref 0.44–1.00)
GFR, Estimated: >60 mL/min (ref >60)
Glucose, Bld: 97 mg/dL (ref 70–99)
Potassium: 3.7 mmol/L (ref 3.5–5.1)
Sodium: 136 mmol/L (ref 135–145)

## 2024-06-21 LAB — CBC
HCT: 38.3 % (ref 36.0–46.0)
Hemoglobin: 12.4 g/dL (ref 12.0–15.0)
MCH: 27.9 pg (ref 26.0–34.0)
MCHC: 32.4 g/dL (ref 30.0–36.0)
MCV: 86.3 fL (ref 80.0–100.0)
Platelets: 189 K/uL (ref 150–400)
RBC: 4.44 MIL/uL (ref 3.87–5.11)
RDW: 13 % (ref 11.5–15.5)
WBC: 7.3 K/uL (ref 4.0–10.5)
nRBC: 0 % (ref 0.0–0.2)

## 2024-06-21 LAB — MAGNESIUM: Magnesium: 1.5 mg/dL — ABNORMAL LOW (ref 1.7–2.4)

## 2024-06-21 LAB — PHOSPHORUS: Phosphorus: 3.5 mg/dL (ref 2.5–4.6)

## 2024-06-21 MED ORDER — ACETAMINOPHEN 325 MG PO TABS: 650.0000 mg | ORAL_TABLET | Freq: Four times a day (QID) | ORAL | Status: DC | PRN

## 2024-06-21 MED ORDER — MAGNESIUM SULFATE 4 GM/100ML IV SOLN
4.0000 g | Freq: Once | INTRAVENOUS | Status: AC
  Administered 2024-06-21: 4 g via INTRAVENOUS
  Filled 2024-06-21: qty 100

## 2024-06-21 MED ORDER — AMLODIPINE BESYLATE 5 MG PO TABS
5.0000 mg | ORAL_TABLET | Freq: Every day | ORAL | Status: DC
  Administered 2024-06-21: 5 mg via ORAL
  Filled 2024-06-21: qty 1

## 2024-06-21 MED ORDER — MORPHINE SULFATE (PF) 2 MG/ML IV SOLN: 2.0000 mg | INTRAVENOUS | Status: DC | PRN

## 2024-06-21 MED ORDER — ENOXAPARIN SODIUM 40 MG/0.4ML IJ SOSY
40.0000 mg | PREFILLED_SYRINGE | INTRAMUSCULAR | Status: DC
  Administered 2024-06-21: 40 mg via SUBCUTANEOUS
  Filled 2024-06-21: qty 0.4

## 2024-06-21 MED ORDER — ALPRAZOLAM 0.5 MG PO TABS
0.5000 mg | ORAL_TABLET | Freq: Once | ORAL | Status: AC
  Administered 2024-06-21: 0.5 mg via ORAL
  Filled 2024-06-21: qty 1

## 2024-06-21 MED ORDER — MELATONIN 5 MG PO TABS: 5.0000 mg | ORAL_TABLET | Freq: Every evening | ORAL | Status: DC | PRN

## 2024-06-21 MED ORDER — QUETIAPINE FUMARATE 25 MG PO TABS: 12.5000 mg | ORAL_TABLET | Freq: Every day | ORAL | Status: DC

## 2024-06-21 MED ORDER — POLYETHYLENE GLYCOL 3350 17 G PO PACK: 17.0000 g | PACK | Freq: Every day | ORAL | Status: DC | PRN

## 2024-06-21 MED ORDER — ALUM & MAG HYDROXIDE-SIMETH 200-200-20 MG/5ML PO SUSP
30.0000 mL | Freq: Once | ORAL | Status: AC
  Administered 2024-06-21: 30 mL via ORAL
  Filled 2024-06-21: qty 30

## 2024-06-21 MED ORDER — NITROGLYCERIN 0.4 MG SL SUBL
0.4000 mg | SUBLINGUAL_TABLET | Freq: Once | SUBLINGUAL | Status: AC
  Administered 2024-06-21: 0.4 mg via SUBLINGUAL
  Filled 2024-06-21: qty 1

## 2024-06-21 MED ORDER — PROCHLORPERAZINE EDISYLATE 10 MG/2ML IJ SOLN: 5.0000 mg | Freq: Four times a day (QID) | INTRAMUSCULAR | Status: DC | PRN

## 2024-06-21 NOTE — Discharge Summary (Addendum)
 Physician Discharge Summary  Rhonda Morales FMW:[id number] DOB: 1950/12/16 DOA: 06/20/2024  Admit date: 06/20/2024 Discharge date: 06/21/2024  Admitted From:  HOME  Disposition: HOME   Recommendations for Outpatient Follow-up:  Follow up with PCP in 1 weeks   Discharge Condition: STABLE   CODE STATUS: FULL DIET :  heart healthy    Brief Hospitalization Summary: Please see all hospital notes, images, labs for full details of the hospitalization. Admission provider HPI:  73 y.o. female with medical history significant for chronic anxiety/depression, hyperlipidemia, hypertension, chronic pain, chronic HFpEF, who presents to the ER due to nonexertional left-sided chest pain with onset yesterday.  The pain is sharp and reproducible on palpation, very tender, no rash present.  Denies subjective chills or fevers.  Denies chest trauma.   In the ER, high-sensitivity troponin negative x 2.  No evidence of acute ischemia on twelve-lead EKG.  She received sublingual nitroglycerin  in the ER with mild improvement of her chest pain.  Admitted by [facility] hospitalist service, for chest pain rule out ACS.   ED Course: Temperature 97.7.  BP 129/84, pulse 72, respiratory 18, O2 saturation 94% on room air.  Patient was admitted with atypical chest pain symptoms mostly related to indigestion and she was admitted for observation for chest pain rule out ACS and had negative troponin x 3 all <2, no acute changes on EKG and stable telemetry monitoring.  Patient was treated with a GI cocktail which seemed to help her symptoms tremendously.  She does have a history of acid reflux and indigestion symptoms and I think that this is the source of her issues.  Hypomagnesemia was treated with IV magnesium  sulfate.  She is stable to discharge home for outpatient follow-up with her PCP.  Patient is being discharged home in stable condition.  Discharge Diagnoses:  Principal Problem:   Chest pain   Discharge  Instructions:  Allergies as of 06/21/2024       Reactions   Hydrocodone Itching, Nausea Only   Atenolol  Other (See Comments)   Estradiol Other (See Comments)   Cream burned   Morphine  And Codeine    Unsure of type of reaction   Penicillin G Other (See Comments)        Medication List     TAKE these medications    acetaminophen  500 MG tablet Commonly known as: TYLENOL  Take 2 tablets (1,000 mg total) by mouth every 6 (six) hours as needed for mild pain (pain score 1-3) (or Fever >/= 101).   ALPRAZolam  0.5 MG tablet Commonly known as: XANAX  Take 0.5 mg by mouth daily as needed for anxiety.   atenolol  50 MG tablet Commonly known as: TENORMIN  Take 50 mg by mouth daily.   AZO BLADDER CONTROL/GO-LESS PO Take by mouth.   cyclobenzaprine  10 MG tablet Commonly known as: FLEXERIL  Take 10 mg by mouth 3 (three) times daily as needed for muscle spasms.   gabapentin  300 MG capsule Commonly known as: NEURONTIN  Take 1 capsule (300 mg total) by mouth 2 (two) times daily.   multivitamin tablet Take 1 tablet by mouth daily.   nitrofurantoin  50 MG capsule Commonly known as: MACRODANTIN  Take 1 capsule (50 mg total) by mouth at bedtime.   ondansetron  4 MG tablet Commonly known as: ZOFRAN  Take 4 mg by mouth 3 (three) times daily as needed for nausea or vomiting.   QUEtiapine  25 MG tablet Commonly known as: SEROQUEL  Take 0.5 tablets (12.5 mg total) by mouth at bedtime.  Follow-up Information     primary care provider. Schedule an appointment as soon as possible for a visit in 1 week(s).   Why: Hospital Follow Up               Allergies  Allergen Reactions   Hydrocodone Itching and Nausea Only   Atenolol  Other (See Comments)   Estradiol Other (See Comments)    Cream burned   Morphine  And Codeine     Unsure of type of reaction   Penicillin G Other (See Comments)   Allergies as of 06/21/2024       Reactions   Hydrocodone Itching, Nausea Only   Atenolol   Other (See Comments)   Estradiol Other (See Comments)   Cream burned   Morphine  And Codeine    Unsure of type of reaction   Penicillin G Other (See Comments)        Medication List     TAKE these medications    acetaminophen  500 MG tablet Commonly known as: TYLENOL  Take 2 tablets (1,000 mg total) by mouth every 6 (six) hours as needed for mild pain (pain score 1-3) (or Fever >/= 101).   ALPRAZolam  0.5 MG tablet Commonly known as: XANAX  Take 0.5 mg by mouth daily as needed for anxiety.   atenolol  50 MG tablet Commonly known as: TENORMIN  Take 50 mg by mouth daily.   AZO BLADDER CONTROL/GO-LESS PO Take by mouth.   cyclobenzaprine  10 MG tablet Commonly known as: FLEXERIL  Take 10 mg by mouth 3 (three) times daily as needed for muscle spasms.   gabapentin  300 MG capsule Commonly known as: NEURONTIN  Take 1 capsule (300 mg total) by mouth 2 (two) times daily.   multivitamin tablet Take 1 tablet by mouth daily.   nitrofurantoin  50 MG capsule Commonly known as: MACRODANTIN  Take 1 capsule (50 mg total) by mouth at bedtime.   ondansetron  4 MG tablet Commonly known as: ZOFRAN  Take 4 mg by mouth 3 (three) times daily as needed for nausea or vomiting.   QUEtiapine  25 MG tablet Commonly known as: SEROQUEL  Take 0.5 tablets (12.5 mg total) by mouth at bedtime.        Procedures/Studies: DG Chest 2 View Result Date: 06/20/2024 CLINICAL DATA:  Chest pain and shortness of breath EXAM: CHEST - 2 VIEW COMPARISON:  07/31/2023. older exams as well FINDINGS: Underinflation. No pneumothorax, effusion or edema. There is some mild bandlike opacity left lung base. Atelectasis is favored. Overlapping cardiac leads. Degenerative changes along the spine. Surgical clips in the upper abdomen. IMPRESSION: Underinflation. Bandlike mild opacity left lung base. Atelectasis is favored. Electronically Signed   By: Ranell Bring M.D.   On: 06/20/2024 17:28     Subjective: Pt feels much better  now, wanting to go home.  Has a lot of indigestion but GI cocktail  helped.  Discharge Exam: Vitals:   06/21/24 0251 06/21/24 0600  BP: 129/84 136/70  Pulse:  76  Resp: 18 16  Temp:  98.3 F (36.8 C)  SpO2: 94% 95%   Vitals:   06/21/24 0236 06/21/24 0242 06/21/24 0251 06/21/24 0600  BP: (!) 147/76 (!) 152/94 129/84 136/70  Pulse: 71   76  Resp: 12 16 18 16   Temp:    98.3 F (36.8 C)  TempSrc:    Oral  SpO2: 93% 95% 94% 95%  Weight:    72.2 kg  Height:        General: Pt is alert, awake, not in acute distress Cardiovascular: RRR, S1/S2 +,  no rubs, no gallops Respiratory: CTA bilaterally, no wheezing, no rhonchi Abdominal: Soft, NT, ND, bowel sounds + Extremities: no edema, no cyanosis   The results of significant diagnostics from this hospitalization (including imaging, microbiology, ancillary and laboratory) are listed below for reference.     Microbiology: No results found for this or any previous visit (from the past 240 hours).   Labs: BNP (last 3 results) No results for input(s): BNP in the last 8760 hours. Basic Metabolic Panel: Recent Labs  Lab 06/20/24 1719 06/21/24 0606  NA 138 136  K 4.1 3.7  CL 103 101  CO2 25 25  GLUCOSE 101* 97  BUN 15 13  CREATININE 0.69 0.65  CALCIUM  9.1 8.6*  MG  --  1.5*  PHOS  --  3.5   Liver Function Tests: Recent Labs  Lab 06/20/24 1719  AST 19  ALT 13  ALKPHOS 77  BILITOT 0.4  PROT 6.8  ALBUMIN 3.3*   No results for input(s): LIPASE, AMYLASE in the last 168 hours. No results for input(s): AMMONIA in the last 168 hours. CBC: Recent Labs  Lab 06/20/24 1612 06/21/24 0606  WBC 8.6 7.3  HGB 14.8 12.4  HCT 46.2* 38.3  MCV 87.2 86.3  PLT 189 189   Cardiac Enzymes: No results for input(s): CKTOTAL, CKMB, CKMBINDEX, TROPONINI in the last 168 hours. BNP: Invalid input(s): POCBNP CBG: No results for input(s): GLUCAP in the last 168 hours. D-Dimer No results for input(s): DDIMER in  the last 72 hours. Hgb A1c No results for input(s): HGBA1C in the last 72 hours. Lipid Profile No results for input(s): CHOL, HDL, LDLCALC, TRIG, CHOLHDL, LDLDIRECT in the last 72 hours. Thyroid function studies No results for input(s): TSH, T4TOTAL, T3FREE, THYROIDAB in the last 72 hours.  Invalid input(s): FREET3 Anemia work up No results for input(s): VITAMINB12, FOLATE, FERRITIN, TIBC, IRON, RETICCTPCT in the last 72 hours. Urinalysis    Component Value Date/Time   COLORURINE YELLOW 07/31/2023 1649   APPEARANCEUR Cloudy (A) 02/18/2024 1136   LABSPEC 1.016 07/31/2023 1649   PHURINE 6.0 07/31/2023 1649   GLUCOSEU Negative 02/18/2024 1136   HGBUR NEGATIVE 07/31/2023 1649   BILIRUBINUR Negative 02/18/2024 1136   KETONESUR NEGATIVE 07/31/2023 1649   PROTEINUR Negative 02/18/2024 1136   PROTEINUR NEGATIVE 07/31/2023 1649   UROBILINOGEN 0.2 08/16/2022 1048   UROBILINOGEN 0.2 04/15/2008 1444   NITRITE Negative 02/18/2024 1136   NITRITE NEGATIVE 07/31/2023 1649   LEUKOCYTESUR 3+ (A) 02/18/2024 1136   LEUKOCYTESUR MODERATE (A) 07/31/2023 1649   Sepsis Labs Recent Labs  Lab 06/20/24 1612 06/21/24 0606  WBC 8.6 7.3   Microbiology No results found for this or any previous visit (from the past 240 hours).  Time coordinating discharge: 32 min  SIGNED:  Afton Louder, MD  [facility] 06/21/2024, 1:08 PM How to contact the [facility] Attending or Consulting provider 7A - 7P or covering provider during after hours 7P -7A, for this patient?  Check the care team in [facility] and look for a) attending/consulting [facility] provider listed and b) the [facility] team listed Log into [email or web address] and use [place]'s universal password to access. If you do not have the password, please contact the hospital operator. Locate the [facility] provider you are looking for under [facility] and page to a number that you can be directly reached. If you still have  difficulty reaching the provider, please page the [facility] (Director on Call) for the Hospitalists listed on amion for assistance.

## 2024-06-21 NOTE — Progress Notes (Signed)
   06/21/24 1146  TOC Brief Assessment  Insurance and Status Reviewed  Patient has primary care physician Yes  Home environment has been reviewed Home w/ spouse  Prior level of function: Independent  Prior/Current Home Services No current home services  Social Drivers of Health Review SDOH reviewed no interventions necessary  Readmission risk has been reviewed Yes  Transition of care needs no transition of care needs at this time

## 2024-06-21 NOTE — Evaluation (Signed)
 Physical Therapy Evaluation Patient Details Name: Rhonda Morales MRN: [id number] DOB: Dec 25, 1950 Today's Date: 06/21/2024  History of Present Illness  Rhonda Morales is a 73 y.o. female with medical history significant for chronic anxiety/depression, hyperlipidemia, hypertension, chronic pain, chronic HFpEF, who presents to the ER due to nonexertional left-sided chest pain with onset yesterday.  The pain is sharp and reproducible on palpation, very tender, no rash present.  Denies subjective chills or fevers.  Denies chest trauma.     In the ER, high-sensitivity troponin negative x 2.  No evidence of acute ischemia on twelve-lead EKG.  She received sublingual nitroglycerin  in the ER with mild improvement of her chest pain.  Admitted by [facility], hospitalist service, for chest pain rule out ACS.   Clinical Impression  Patient functioning at baseline for functional mobility and gait other than c/o right sided epigastric pain/discomfort, otherwise demonstrates good return for ambulating in room, hallways without loss of balance or need for an AD. Plan:  Patient discharged from physical therapy to care of nursing for ambulation daily as tolerated for length of stay.          If plan is discharge home, recommend the following: Other (comment) (near baseline)   Can travel by private vehicle        Equipment Recommendations None recommended by PT  Recommendations for Other Services       Functional Status Assessment Patient has not had a recent decline in their functional status     Precautions / Restrictions Precautions Precautions: None Recall of Precautions/Restrictions: Intact Restrictions Weight Bearing Restrictions Per Provider Order: No      Mobility  Bed Mobility Overal bed mobility: Independent                  Transfers Overall transfer level: Independent                      Ambulation/Gait Ambulation/Gait assistance: Independent Gait Distance (Feet): 200  Feet Assistive device: None Gait Pattern/deviations: WFL(Within Functional Limits) Gait velocity: normal     General Gait Details: Patient demonstrates good return for ambulation in room, hallways without loss of balance or need for an AD  Stairs            Wheelchair Mobility     Tilt Bed    Modified Rankin (Stroke Patients Only)       Balance Overall balance assessment: Independent                                           Pertinent Vitals/Pain Pain Assessment Pain Assessment: 0-10 Pain Score: 3  Pain Location: chest, back, stomach Pain Descriptors / Indicators: Other (Comment) (Indigestion) Pain Intervention(s): Monitored during session    Home Living Family/patient expects to be discharged to:: Private residence Living Arrangements: Spouse/significant other;Children Available Help at Discharge: Available 24 hours/day Type of Home: House Home Access: Stairs to enter Entrance Stairs-Rails: Right;Left;Can reach both Entrance Stairs-Number of Steps: 3   Home Layout: One level Home Equipment: Cane - single point;BSC/3in1;Grab bars - tub/shower Additional Comments: Pt reports same living history as prior admission.    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 Comments: Independent     Extremity/Trunk Assessment   Upper Extremity Assessment  Upper Extremity Assessment: Defer to OT evaluation    Lower Extremity Assessment Lower Extremity Assessment: Overall WFL for tasks assessed    Cervical / Trunk Assessment Cervical / Trunk Assessment: Normal  Communication   Communication Communication: No apparent difficulties    Cognition Arousal: Alert Behavior During Therapy: WFL for tasks assessed/performed                             Following commands: Intact       Cueing Cueing Techniques: Verbal cues      General Comments      Exercises     Assessment/Plan    PT Assessment Patient does not need any further PT services  PT Problem List         PT Treatment Interventions      PT Goals (Current goals can be found in the Care Plan section)  Acute Rehab PT Goals Patient Stated Goal: return home with family to assist PT Goal Formulation: With patient Time For Goal Achievement: 06/21/24 Potential to Achieve Goals: Good    Frequency       Co-evaluation PT/OT/SLP Co-Evaluation/Treatment: Yes Reason for Co-Treatment: To address functional/ADL transfers PT goals addressed during session: Mobility/safety with mobility;Balance OT goals addressed during session: ADL's and self-care       AM-PAC PT 6 Clicks Mobility  Outcome Measure Help needed turning from your back to your side while in a flat bed without using bedrails?: None Help needed moving from lying on your back to sitting on the side of a flat bed without using bedrails?: None Help needed moving to and from a bed to a chair (including a wheelchair)?: None Help needed standing up from a chair using your arms (e.g., wheelchair or bedside chair)?: None Help needed to walk in hospital room?: None Help needed climbing 3-5 steps with a railing? : None 6 Click Score: 24    End of Session   Activity Tolerance: Patient tolerated treatment well Patient left: in bed;with call bell/phone within reach Nurse Communication: Mobility status PT Visit Diagnosis: Unsteadiness on feet (R26.81);Other abnormalities of gait and mobility (R26.89);Muscle weakness (generalized) (M62.81)    Time: 9085-9077 PT Time Calculation (min) (ACUTE ONLY): 8 min   Charges:   PT Evaluation $PT Eval Low Complexity: 1 Low PT Treatments $Therapeutic Activity: 8-22 mins PT General Charges $$ ACUTE PT VISIT: 1 Visit         12:06 PM, 06/21/24 Lynwood Music, MPT Physical Therapist with [facility] 336 [phone] office [phone]  mobile phone

## 2024-06-21 NOTE — Plan of Care (Signed)
  Problem: Education: Goal: Knowledge of General Education information will improve Description: Including pain rating scale, medication(s)/side effects and non-pharmacologic comfort measures Outcome: Not Met (add Reason)   Problem: Health Behavior/Discharge Planning: Goal: Ability to manage health-related needs will improve Outcome: Not Met (add Reason)   Problem: Clinical Measurements: Goal: Cardiovascular complication will be avoided Outcome: Progressing   Problem: Activity: Goal: Risk for activity intolerance will decrease Outcome: Progressing   Problem: Nutrition: Goal: Adequate nutrition will be maintained Outcome: Not Met (add Reason)

## 2024-06-21 NOTE — Evaluation (Signed)
 Occupational Therapy Evaluation Patient Details Name: Rhonda Morales MRN: [id number] DOB: 25-Jul-1951 Today's Date: 06/21/2024   History of Present Illness   Rhonda Morales is a 73 y.o. female with medical history significant for chronic anxiety/depression, hyperlipidemia, hypertension, chronic pain, chronic HFpEF, who presents to the ER due to nonexertional left-sided chest pain with onset yesterday.  The pain is sharp and reproducible on palpation, very tender, no rash present.  Denies subjective chills or fevers.  Denies chest trauma.     In the ER, high-sensitivity troponin negative x 2.  No evidence of acute ischemia on twelve-lead EKG.  She received sublingual nitroglycerin  in the ER with mild improvement of her chest pain.  Admitted by [facility], hospitalist service, for chest pain rule out ACS. (per DO)     Clinical Impressions Pt agreeable to OT and PT co-evaluation. Pt appears to be at or near baseline function for ADL's and mobility. Able to demonstrate independence with ADL's and ambulation in the room/hall. B UE strength WFL today. Pt is not recommended for further acute OT services and will be discharged to care of nursing staff for remaining length of stay.               Functional Status Assessment   Patient has not had a recent decline in their functional status     Equipment Recommendations   None recommended by OT             Precautions/Restrictions   Precautions Precautions: None Restrictions Weight Bearing Restrictions Per Provider Order: No     Mobility Bed Mobility Overal bed mobility: Independent                  Transfers Overall transfer level: Independent                        Balance Overall balance assessment: Independent                                         ADL either performed or assessed with clinical judgement   ADL Overall ADL's : Independent                                              Vision Baseline Vision/History: 1 Wears glasses Ability to See in Adequate Light: 0 Adequate Patient Visual Report: No change from baseline Vision Assessment?: No apparent visual deficits;Wears glasses for reading     Perception Perception: Not tested       Praxis Praxis: Not tested       Pertinent Vitals/Pain Pain Assessment Pain Assessment: 0-10 Pain Score: 3  Pain Location: chest, back, stomach Pain Descriptors / Indicators: Other (Comment) (Indigestion.) Pain Intervention(s): Monitored during session     Extremity/Trunk Assessment Upper Extremity Assessment Upper Extremity Assessment: Overall WFL for tasks assessed   Lower Extremity Assessment Lower Extremity Assessment: Defer to PT evaluation   Cervical / Trunk Assessment Cervical / Trunk Assessment: Normal   Communication Communication Communication: No apparent difficulties   Cognition Arousal: Alert Behavior During Therapy: WFL for tasks assessed/performed Cognition: No apparent impairments  Following commands: Intact       Cueing  General Comments   Cueing Techniques: Verbal cues                 Home Living Family/patient expects to be discharged to:: Private residence Living Arrangements: Spouse/significant other;Children Available Help at Discharge: Available 24 hours/day Type of Home: House Home Access: Stairs to enter [employer] of Steps: 3 Entrance Stairs-Rails: Right;Left;Can reach both Home Layout: One level     Bathroom Shower/Tub: Chief Strategy Officer: Standard Bathroom Accessibility: Yes How Accessible: Accessible via wheelchair;Accessible via walker Home Equipment: Cane - single point;BSC/3in1;Grab bars - tub/shower   Additional Comments: Pt reports same living history as prior admission.      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: Independent                            Co-evaluation PT/OT/SLP Co-Evaluation/Treatment: Yes Reason for Co-Treatment: To address functional/ADL transfers   OT goals addressed during session: ADL's and self-care      AM-PAC OT 6 Clicks Daily Activity     Outcome Measure Help from another person eating meals?: None Help from another person taking care of personal grooming?: None Help from another person toileting, which includes using toliet, bedpan, or urinal?: None Help from another person bathing (including washing, rinsing, drying)?: None Help from another person to put on and taking off regular upper body clothing?: None Help from another person to put on and taking off regular lower body clothing?: None 6 Click Score: 24   End of Session    Activity Tolerance: Patient tolerated treatment well Patient left: in bed;with call bell/phone within reach  OT Visit Diagnosis: Unsteadiness on feet (R26.81);Pain Pain - Right/Left: Left Pain - part of body:  (chest)                Time: 9084-9076 OT Time Calculation (min): 8 min Charges:  OT General Charges $OT Visit: 1 Visit OT Evaluation $OT Eval Low Complexity: 1 Low  Rhonda Morales OT, MOT   [name] 06/21/2024, 10:57 AM

## 2024-06-21 NOTE — Progress Notes (Signed)
 Patient called out c/o chest pain 7/10 radiating upward towards her throat. State it is sharp pressure. Vital signs and EKG obtained. Provider notified. Order for nitroglycerin  x1 placed and given to patient. Pain improved to 6/10 at 5 minute mark. BP repeated. MD notified.

## 2024-06-21 NOTE — Discharge Instructions (Signed)
 IMPORTANT INFORMATION: PAY CLOSE ATTENTION   PHYSICIAN DISCHARGE INSTRUCTIONS  Follow with Primary care provider  Luke Agent, MD (Inactive)  and other consultants as instructed by your Hospitalist Physician  SEEK MEDICAL CARE OR RETURN TO EMERGENCY ROOM IF SYMPTOMS COME BACK, WORSEN OR NEW PROBLEM DEVELOPS   Please note: You were cared for by a hospitalist during your hospital stay. Every effort will be made to forward records to your primary care provider.  You can request that your primary care provider send for your hospital records if they have not received them.  Once you are discharged, your primary care physician will handle any further medical issues. Please note that NO REFILLS for any discharge medications will be authorized once you are discharged, as it is imperative that you return to your primary care physician (or establish a relationship with a primary care physician if you do not have one) for your post hospital discharge needs so that they can reassess your need for medications and monitor your lab values.  Please get a complete blood count and chemistry panel checked by your Primary MD at your next visit, and again as instructed by your Primary MD.  Get Medicines reviewed and adjusted: Please take all your medications with you for your next visit with your Primary MD  Laboratory/radiological data: Please request your Primary MD to go over all hospital tests and procedure/radiological results at the follow up, please ask your primary care provider to get all Hospital records sent to his/her office.  In some cases, they will be blood work, cultures and biopsy results pending at the time of your discharge. Please request that your primary care provider follow up on these results.  If you are diabetic, please bring your blood sugar readings with you to your follow up appointment with primary care.    Please call and make your follow up appointments as soon as possible.    Also  Note the following: If you experience worsening of your admission symptoms, develop shortness of breath, life threatening emergency, suicidal or homicidal thoughts you must seek medical attention immediately by calling 911 or calling your MD immediately  if symptoms less severe.  You must read complete instructions/literature along with all the possible adverse reactions/side effects for all the Medicines you take and that have been prescribed to you. Take any new Medicines after you have completely understood and accpet all the possible adverse reactions/side effects.   Do not drive when taking Pain medications or sleeping medications (Benzodiazepines)  Do not take more than prescribed Pain, Sleep and Anxiety Medications. It is not advisable to combine anxiety,sleep and pain medications without talking with your primary care practitioner  Special Instructions: If you have smoked or chewed Tobacco  in the last 2 yrs please stop smoking, stop any regular Alcohol  and or any Recreational drug use.  Wear Seat belts while driving.  Do not drive if taking any narcotic, mind altering or controlled substances or recreational drugs or alcohol.

## 2024-06-27 NOTE — Progress Notes (Unsigned)
 GI Office Note    Referring Provider: Vicci Afton CROME, MD Primary Care Physician:  Roni, [facility], Raina Elizabeth NP Primary Gastroenterologist: Carlin POUR. Cindie, DO   Chief Complaint   Chief Complaint  Patient presents with   ER Follow up    Was seen in ER on 7/20 for chest pain and was told to see us . Cardiac issues was ruled out. Pt states that she has a lot of gas as well and notices the chest pain when she eats.     History of Present Illness   Rhonda Morales is a 73 y.o. female presenting today  at the request of Dr. Clanford Johnson for further evaluation of gastritis/indigestion. Recently discharged from hospital. Presented with left-sided chest pain/atypical chest pain. She ruled out for ACS. Treated with GI cocktail which helped.    Really denies any chronic GI symptoms.  Recently prior to admission she noted increasing difficulty with nausea, gas especially when eating.  Today she presented to the ED she noted pain into the chest and she was concerned she may be having cardiac issues.  She had tried several over-the-counter antacids without relief.  She was admitted to rule out acute coronary syndrome.  Notably GI cocktail helped in the ED.  Serial troponins were negative.  Chest x-ray with underinflation, mild bandlike opacity left lung base favored to be atelectasis.  Patient continues to have some milder symptoms.  She is not sure if switching her diet prior to onset of symptoms is what triggered things.  She typically drink carbonated beverages and switch to drinking only tea.  She felt like she cannot get relief of air buildup like she did in the past with belching with carbonated beverages.  She had some nausea this morning, improved after drinking a Coke.  She denies dysphagia, vomiting.  She has never been on a PPI that she can remember.  Bowel movements are regular.  No melena or rectal bleeding.  She has a good appetite.  Reports recent  Cologuard was negative and plans to have another in 3 years.  This was orchestrated through her PCP.      Medications   Current Outpatient Medications  Medication Sig Dispense Refill   acetaminophen  (TYLENOL ) 500 MG tablet Take 2 tablets (1,000 mg total) by mouth every 6 (six) hours as needed for mild pain (pain score 1-3) (or Fever >/= 101).     atenolol  (TENORMIN ) 50 MG tablet Take 50 mg by mouth daily.     gabapentin  (NEURONTIN ) 300 MG capsule Take 1 capsule (300 mg total) by mouth 2 (two) times daily. (Patient taking differently: Take 300 mg by mouth 3 (three) times daily.)     Multiple Vitamin (MULTIVITAMIN) tablet Take 1 tablet by mouth daily.     nitrofurantoin  (MACRODANTIN ) 50 MG capsule Take 1 capsule (50 mg total) by mouth at bedtime. 30 capsule 11   ondansetron  (ZOFRAN ) 4 MG tablet Take 4 mg by mouth 3 (three) times daily as needed for nausea or vomiting.     QUEtiapine  (SEROQUEL ) 25 MG tablet Take 0.5 tablets (12.5 mg total) by mouth at bedtime. (Patient taking differently: Take 12.5 mg by mouth at bedtime. Takes 1-2 nightly)     No current facility-administered medications for this visit.    Allergies   Allergies as of 06/28/2024 - Review Complete 06/28/2024  Allergen Reaction Noted   Hydrocodone Itching and Nausea Only 02/20/2017   Atenolol  Other (See Comments) 07/31/2023  Estradiol Other (See Comments) 01/18/2020   Morphine  and codeine  12/23/2011   Nsaids  06/28/2024   Penicillin g Other (See Comments) 06/12/2018    Past Medical History   Past Medical History:  Diagnosis Date   Anxiety    Benign meningioma of brain (HCC)    Chronic pain    Grade I diastolic dysfunction    HTN (hypertension)    Hyperlipidemia 05/04/2021   Interstitial cystitis 05/04/2021    Past Surgical History   Past Surgical History:  Procedure Laterality Date   ABDOMINAL HYSTERECTOMY     BACK SURGERY     X2   CESAREAN SECTION     X2   CHOLECYSTECTOMY     REPLACEMENT TOTAL KNEE  Right    TOTAL HIP ARTHROPLASTY Left     Past Family History   Family History  Problem Relation Age of Onset   Colon cancer Neg Hx     Past Social History   Social History   Socioeconomic History   Marital status: Married    Spouse name: Not on file   Number of children: Not on file   Years of education: Not on file   Highest education level: Not on file  Occupational History   Not on file  Tobacco Use   Smoking status: Never   Smokeless tobacco: Never  Vaping Use   Vaping status: Never Used  Substance and Sexual Activity   Alcohol use: Not Currently   Drug use: Never   Sexual activity: Not on file  Other Topics Concern   Not on file  Social History Narrative   Not on file   Social Drivers of Health   Financial Resource Strain: Not on file  Food Insecurity: No Food Insecurity (06/20/2024)   Hunger Vital Sign    Worried About Running Out of Food in the Last Year: Never true    Ran Out of Food in the Last Year: Never true  Transportation Needs: No Transportation Needs (06/20/2024)   PRAPARE - Administrator, Civil Service (Medical): No    Lack of Transportation (Non-Medical): No  Physical Activity: Not on file  Stress: Not on file  Social Connections: Socially Integrated (06/20/2024)   Social Connection and Isolation Panel    Frequency of Communication with Friends and Family: Three times a week    Frequency of Social Gatherings with Friends and Family: Three times a week    Attends Religious Services: 1 to 4 times per year    Active Member of Clubs or Organizations: No    Attends Banker Meetings: 1 to 4 times per year    Marital Status: Married  Catering manager Violence: Not At Risk (06/20/2024)   Humiliation, Afraid, Rape, and Kick questionnaire    Fear of Current or Ex-Partner: No    Emotionally Abused: No    Physically Abused: No    Sexually Abused: No    Review of Systems   General: Negative for anorexia, weight loss, fever,  chills, fatigue, weakness. Eyes: Negative for vision changes.  ENT: Negative for hoarseness, difficulty swallowing , nasal congestion. CV: Negative for chest pain, angina, palpitations, dyspnea on exertion, peripheral edema.  Respiratory: Negative for dyspnea at rest, dyspnea on exertion, cough, sputum, wheezing.  GI: See history of present illness. GU:  Negative for dysuria, hematuria, urinary incontinence, urinary frequency, nocturnal urination.  MS: Negative for joint pain, low back pain.  Derm: Negative for rash or itching.  Neuro: Negative for weakness,  abnormal sensation, seizure, frequent headaches, memory loss,  confusion.  Psych: Negative for anxiety, depression, suicidal ideation, hallucinations.  Endo: Negative for unusual weight change.  Heme: Negative for bruising or bleeding. Allergy: Negative for rash or hives.  Physical Exam   BP (!) 156/88 (BP Location: Right Arm, Patient Position: Sitting, Cuff Size: Normal)   Pulse (!) 59   Temp 98.8 F (37.1 C) (Oral)   Ht 5' 2 (1.575 m)   Wt 155 lb (70.3 kg)   SpO2 95%   BMI 28.35 kg/m    General: Well-nourished, well-developed in no acute distress.  Head: Normocephalic, atraumatic.   Eyes: Conjunctiva pink, no icterus. Mouth: Oropharyngeal mucosa moist and pink  Neck: Supple without thyromegaly, masses, or lymphadenopathy.  Lungs: Clear to auscultation bilaterally.  Heart: Regular rate and rhythm, no murmurs rubs or gallops.  Abdomen: Bowel sounds are normal, nontender, nondistended, no hepatosplenomegaly or masses,  no abdominal bruits or hernia, no rebound or guarding.   Rectal: not performed Extremities: No lower extremity edema. No clubbing or deformities.  Neuro: Alert and oriented x 4 , grossly normal neurologically.  Skin: Warm and dry, no rash or jaundice.   Psych: Alert and cooperative, normal mood and affect.  Labs   Lab Results  Component Value Date   NA 136 06/21/2024   CL 101 06/21/2024   K 3.7  06/21/2024   CO2 25 06/21/2024   BUN 13 06/21/2024   CREATININE 0.65 06/21/2024   GFRNONAA >60 06/21/2024   CALCIUM  8.6 (L) 06/21/2024   PHOS 3.5 06/21/2024   ALBUMIN 3.3 (L) 06/20/2024   GLUCOSE 97 06/21/2024   Lab Results  Component Value Date   WBC 7.3 06/21/2024   HGB 12.4 06/21/2024   HCT 38.3 06/21/2024   MCV 86.3 06/21/2024   PLT 189 06/21/2024   Lab Results  Component Value Date   ALT 13 06/20/2024   AST 19 06/20/2024   ALKPHOS 77 06/20/2024   BILITOT 0.4 06/20/2024    Imaging Studies   DG Chest 2 View Result Date: 06/20/2024 CLINICAL DATA:  Chest pain and shortness of breath EXAM: CHEST - 2 VIEW COMPARISON:  07/31/2023. older exams as well FINDINGS: Underinflation. No pneumothorax, effusion or edema. There is some mild bandlike opacity left lung base. Atelectasis is favored. Overlapping cardiac leads. Degenerative changes along the spine. Surgical clips in the upper abdomen. IMPRESSION: Underinflation. Bandlike mild opacity left lung base. Atelectasis is favored. Electronically Signed   By: Ranell Bring M.D.   On: 06/20/2024 17:28    Assessment/Plan:   GERD/epigastric pain/chest pain: -likely some underlying indigestion intermittently, but recent onset of epigastric pain radiating into chest prompting admission.  -continues mylanta with some relief  -will start pantoprazole  40mg  daily before breakfast. -EGD with Dr. Cindie. ASA 2. I have discussed the risks, alternatives, benefits with regards to but not limited to the risk of reaction to medication, bleeding, infection, perforation and the patient is agreeable to proceed. Written consent to be obtained.    Sonny RAMAN. Ezzard, MHS, PA-C [facility]

## 2024-06-27 NOTE — H&P (View-Only) (Signed)
 GI Office Note    Referring Provider: Vicci Afton CROME, MD Primary Care Physician:  Roni, [facility], Raina Elizabeth NP Primary Gastroenterologist: Carlin POUR. Cindie, DO   Chief Complaint   Chief Complaint  Patient presents with   ER Follow up    Was seen in ER on 7/20 for chest pain and was told to see us . Cardiac issues was ruled out. Pt states that she has a lot of gas as well and notices the chest pain when she eats.     History of Present Illness   Rhonda Morales is a 73 y.o. female presenting today  at the request of Dr. Clanford Johnson for further evaluation of gastritis/indigestion. Recently discharged from hospital. Presented with left-sided chest pain/atypical chest pain. She ruled out for ACS. Treated with GI cocktail which helped.    Really denies any chronic GI symptoms.  Recently prior to admission she noted increasing difficulty with nausea, gas especially when eating.  Today she presented to the ED she noted pain into the chest and she was concerned she may be having cardiac issues.  She had tried several over-the-counter antacids without relief.  She was admitted to rule out acute coronary syndrome.  Notably GI cocktail helped in the ED.  Serial troponins were negative.  Chest x-ray with underinflation, mild bandlike opacity left lung base favored to be atelectasis.  Patient continues to have some milder symptoms.  She is not sure if switching her diet prior to onset of symptoms is what triggered things.  She typically drink carbonated beverages and switch to drinking only tea.  She felt like she cannot get relief of air buildup like she did in the past with belching with carbonated beverages.  She had some nausea this morning, improved after drinking a Coke.  She denies dysphagia, vomiting.  She has never been on a PPI that she can remember.  Bowel movements are regular.  No melena or rectal bleeding.  She has a good appetite.  Reports recent  Cologuard was negative and plans to have another in 3 years.  This was orchestrated through her PCP.      Medications   Current Outpatient Medications  Medication Sig Dispense Refill   acetaminophen  (TYLENOL ) 500 MG tablet Take 2 tablets (1,000 mg total) by mouth every 6 (six) hours as needed for mild pain (pain score 1-3) (or Fever >/= 101).     atenolol  (TENORMIN ) 50 MG tablet Take 50 mg by mouth daily.     gabapentin  (NEURONTIN ) 300 MG capsule Take 1 capsule (300 mg total) by mouth 2 (two) times daily. (Patient taking differently: Take 300 mg by mouth 3 (three) times daily.)     Multiple Vitamin (MULTIVITAMIN) tablet Take 1 tablet by mouth daily.     nitrofurantoin  (MACRODANTIN ) 50 MG capsule Take 1 capsule (50 mg total) by mouth at bedtime. 30 capsule 11   ondansetron  (ZOFRAN ) 4 MG tablet Take 4 mg by mouth 3 (three) times daily as needed for nausea or vomiting.     QUEtiapine  (SEROQUEL ) 25 MG tablet Take 0.5 tablets (12.5 mg total) by mouth at bedtime. (Patient taking differently: Take 12.5 mg by mouth at bedtime. Takes 1-2 nightly)     No current facility-administered medications for this visit.    Allergies   Allergies as of 06/28/2024 - Review Complete 06/28/2024  Allergen Reaction Noted   Hydrocodone Itching and Nausea Only 02/20/2017   Atenolol  Other (See Comments) 07/31/2023  Estradiol Other (See Comments) 01/18/2020   Morphine  and codeine  12/23/2011   Nsaids  06/28/2024   Penicillin g Other (See Comments) 06/12/2018    Past Medical History   Past Medical History:  Diagnosis Date   Anxiety    Benign meningioma of brain (HCC)    Chronic pain    Grade I diastolic dysfunction    HTN (hypertension)    Hyperlipidemia 05/04/2021   Interstitial cystitis 05/04/2021    Past Surgical History   Past Surgical History:  Procedure Laterality Date   ABDOMINAL HYSTERECTOMY     BACK SURGERY     X2   CESAREAN SECTION     X2   CHOLECYSTECTOMY     REPLACEMENT TOTAL KNEE  Right    TOTAL HIP ARTHROPLASTY Left     Past Family History   Family History  Problem Relation Age of Onset   Colon cancer Neg Hx     Past Social History   Social History   Socioeconomic History   Marital status: Married    Spouse name: Not on file   Number of children: Not on file   Years of education: Not on file   Highest education level: Not on file  Occupational History   Not on file  Tobacco Use   Smoking status: Never   Smokeless tobacco: Never  Vaping Use   Vaping status: Never Used  Substance and Sexual Activity   Alcohol use: Not Currently   Drug use: Never   Sexual activity: Not on file  Other Topics Concern   Not on file  Social History Narrative   Not on file   Social Drivers of Health   Financial Resource Strain: Not on file  Food Insecurity: No Food Insecurity (06/20/2024)   Hunger Vital Sign    Worried About Running Out of Food in the Last Year: Never true    Ran Out of Food in the Last Year: Never true  Transportation Needs: No Transportation Needs (06/20/2024)   PRAPARE - Administrator, Civil Service (Medical): No    Lack of Transportation (Non-Medical): No  Physical Activity: Not on file  Stress: Not on file  Social Connections: Socially Integrated (06/20/2024)   Social Connection and Isolation Panel    Frequency of Communication with Friends and Family: Three times a week    Frequency of Social Gatherings with Friends and Family: Three times a week    Attends Religious Services: 1 to 4 times per year    Active Member of Clubs or Organizations: No    Attends Banker Meetings: 1 to 4 times per year    Marital Status: Married  Catering manager Violence: Not At Risk (06/20/2024)   Humiliation, Afraid, Rape, and Kick questionnaire    Fear of Current or Ex-Partner: No    Emotionally Abused: No    Physically Abused: No    Sexually Abused: No    Review of Systems   General: Negative for anorexia, weight loss, fever,  chills, fatigue, weakness. Eyes: Negative for vision changes.  ENT: Negative for hoarseness, difficulty swallowing , nasal congestion. CV: Negative for chest pain, angina, palpitations, dyspnea on exertion, peripheral edema.  Respiratory: Negative for dyspnea at rest, dyspnea on exertion, cough, sputum, wheezing.  GI: See history of present illness. GU:  Negative for dysuria, hematuria, urinary incontinence, urinary frequency, nocturnal urination.  MS: Negative for joint pain, low back pain.  Derm: Negative for rash or itching.  Neuro: Negative for weakness,  abnormal sensation, seizure, frequent headaches, memory loss,  confusion.  Psych: Negative for anxiety, depression, suicidal ideation, hallucinations.  Endo: Negative for unusual weight change.  Heme: Negative for bruising or bleeding. Allergy: Negative for rash or hives.  Physical Exam   BP (!) 156/88 (BP Location: Right Arm, Patient Position: Sitting, Cuff Size: Normal)   Pulse (!) 59   Temp 98.8 F (37.1 C) (Oral)   Ht 5' 2 (1.575 m)   Wt 155 lb (70.3 kg)   SpO2 95%   BMI 28.35 kg/m    General: Well-nourished, well-developed in no acute distress.  Head: Normocephalic, atraumatic.   Eyes: Conjunctiva pink, no icterus. Mouth: Oropharyngeal mucosa moist and pink  Neck: Supple without thyromegaly, masses, or lymphadenopathy.  Lungs: Clear to auscultation bilaterally.  Heart: Regular rate and rhythm, no murmurs rubs or gallops.  Abdomen: Bowel sounds are normal, nontender, nondistended, no hepatosplenomegaly or masses,  no abdominal bruits or hernia, no rebound or guarding.   Rectal: not performed Extremities: No lower extremity edema. No clubbing or deformities.  Neuro: Alert and oriented x 4 , grossly normal neurologically.  Skin: Warm and dry, no rash or jaundice.   Psych: Alert and cooperative, normal mood and affect.  Labs   Lab Results  Component Value Date   NA 136 06/21/2024   CL 101 06/21/2024   K 3.7  06/21/2024   CO2 25 06/21/2024   BUN 13 06/21/2024   CREATININE 0.65 06/21/2024   GFRNONAA >60 06/21/2024   CALCIUM  8.6 (L) 06/21/2024   PHOS 3.5 06/21/2024   ALBUMIN 3.3 (L) 06/20/2024   GLUCOSE 97 06/21/2024   Lab Results  Component Value Date   WBC 7.3 06/21/2024   HGB 12.4 06/21/2024   HCT 38.3 06/21/2024   MCV 86.3 06/21/2024   PLT 189 06/21/2024   Lab Results  Component Value Date   ALT 13 06/20/2024   AST 19 06/20/2024   ALKPHOS 77 06/20/2024   BILITOT 0.4 06/20/2024    Imaging Studies   DG Chest 2 View Result Date: 06/20/2024 CLINICAL DATA:  Chest pain and shortness of breath EXAM: CHEST - 2 VIEW COMPARISON:  07/31/2023. older exams as well FINDINGS: Underinflation. No pneumothorax, effusion or edema. There is some mild bandlike opacity left lung base. Atelectasis is favored. Overlapping cardiac leads. Degenerative changes along the spine. Surgical clips in the upper abdomen. IMPRESSION: Underinflation. Bandlike mild opacity left lung base. Atelectasis is favored. Electronically Signed   By: Ranell Bring M.D.   On: 06/20/2024 17:28    Assessment/Plan:   GERD/epigastric pain/chest pain: -likely some underlying indigestion intermittently, but recent onset of epigastric pain radiating into chest prompting admission.  -continues mylanta with some relief  -will start pantoprazole  40mg  daily before breakfast. -EGD with Dr. Cindie. ASA 2. I have discussed the risks, alternatives, benefits with regards to but not limited to the risk of reaction to medication, bleeding, infection, perforation and the patient is agreeable to proceed. Written consent to be obtained.    Sonny RAMAN. Ezzard, MHS, PA-C [facility]

## 2024-06-28 ENCOUNTER — Encounter: Payer: Self-pay | Admitting: Gastroenterology

## 2024-06-28 ENCOUNTER — Ambulatory Visit (INDEPENDENT_AMBULATORY_CARE_PROVIDER_SITE_OTHER): Admitting: Gastroenterology

## 2024-06-28 VITALS — BP 156/88 | HR 59 | Temp 98.8°F | Ht 62.0 in | Wt 155.0 lb

## 2024-06-28 DIAGNOSIS — R1013 Epigastric pain: Secondary | ICD-10-CM | POA: Insufficient documentation

## 2024-06-28 DIAGNOSIS — K219 Gastro-esophageal reflux disease without esophagitis: Secondary | ICD-10-CM | POA: Diagnosis not present

## 2024-06-28 MED ORDER — PANTOPRAZOLE SODIUM 40 MG PO TBEC
40.0000 mg | DELAYED_RELEASE_TABLET | Freq: Every day | ORAL | 3 refills | Status: AC
Start: 2024-06-28 — End: ?

## 2024-06-28 NOTE — Patient Instructions (Addendum)
 Start pantoprazole  40mg  daily before breakfast. Upper endoscopy to be scheduled. Call if you have recurrent symptoms that do not resolve with Mylanta or other antacids.

## 2024-06-29 ENCOUNTER — Telehealth: Payer: Self-pay | Admitting: *Deleted

## 2024-06-29 ENCOUNTER — Encounter: Payer: Self-pay | Admitting: *Deleted

## 2024-06-29 NOTE — Telephone Encounter (Signed)
 Patient called back. Scheduled for 8/22. Aware will send instructions

## 2024-06-29 NOTE — Telephone Encounter (Signed)
LMOVM to call back to schedule EGD with Dr. Marletta Lor, ASA 2

## 2024-07-23 ENCOUNTER — Ambulatory Visit (HOSPITAL_COMMUNITY): Admitting: Certified Registered Nurse Anesthetist

## 2024-07-23 ENCOUNTER — Encounter (HOSPITAL_COMMUNITY)
Admission: RE | Disposition: A | Discharge status: Home / Self Care | Payer: Self-pay | Source: Home / Self Care | Attending: Internal Medicine

## 2024-07-23 ENCOUNTER — Encounter (HOSPITAL_COMMUNITY): Payer: Self-pay | Admitting: Internal Medicine

## 2024-07-23 ENCOUNTER — Other Ambulatory Visit: Payer: Self-pay

## 2024-07-23 ENCOUNTER — Ambulatory Visit (HOSPITAL_COMMUNITY)
Admission: RE | Admit: 2024-07-23 | Discharge: 2024-07-23 | Disposition: A | Discharge status: Home / Self Care | Attending: Internal Medicine | Admitting: Internal Medicine

## 2024-07-23 DIAGNOSIS — I10 Essential (primary) hypertension: Secondary | ICD-10-CM | POA: Diagnosis not present

## 2024-07-23 DIAGNOSIS — T182XXA Foreign body in stomach, initial encounter: Secondary | ICD-10-CM

## 2024-07-23 DIAGNOSIS — R143 Flatulence: Secondary | ICD-10-CM | POA: Diagnosis not present

## 2024-07-23 DIAGNOSIS — R0789 Other chest pain: Secondary | ICD-10-CM | POA: Diagnosis not present

## 2024-07-23 DIAGNOSIS — R110 Nausea: Secondary | ICD-10-CM | POA: Diagnosis not present

## 2024-07-23 DIAGNOSIS — F32A Depression, unspecified: Secondary | ICD-10-CM | POA: Insufficient documentation

## 2024-07-23 DIAGNOSIS — F419 Anxiety disorder, unspecified: Secondary | ICD-10-CM | POA: Insufficient documentation

## 2024-07-23 DIAGNOSIS — R1013 Epigastric pain: Secondary | ICD-10-CM

## 2024-07-23 HISTORY — PX: ESOPHAGOGASTRODUODENOSCOPY: SHX5428

## 2024-07-23 SURGERY — EGD (ESOPHAGOGASTRODUODENOSCOPY)
Anesthesia: General

## 2024-07-23 MED ORDER — LIDOCAINE 2% (20 MG/ML) 5 ML SYRINGE
INTRAMUSCULAR | Status: DC | PRN
  Administered 2024-07-23: 60 mg via INTRAVENOUS

## 2024-07-23 MED ORDER — PROPOFOL 10 MG/ML IV BOLUS
INTRAVENOUS | Status: DC | PRN
  Administered 2024-07-23: 80 mg via INTRAVENOUS

## 2024-07-23 MED ORDER — LACTATED RINGERS IV SOLN: INTRAVENOUS | Status: DC | PRN

## 2024-07-23 MED ORDER — LACTATED RINGERS IV SOLN: INTRAVENOUS | Status: DC

## 2024-07-23 NOTE — Discharge Instructions (Addendum)
 EGD Discharge instructions Please read the instructions outlined below and refer to this sheet in the next few weeks. These discharge instructions provide you with general information on caring for yourself after you leave the hospital. Your doctor may also give you specific instructions. While your treatment has been planned according to the most current medical practices available, unavoidable complications occasionally occur. If you have any problems or questions after discharge, please call your doctor. ACTIVITY You may resume your regular activity but move at a slower pace for the next 24 hours.  Take frequent rest periods for the next 24 hours.  Walking will help expel (get rid of) the air and reduce the bloated feeling in your abdomen.  No driving for 24 hours (because of the anesthesia (medicine) used during the test).  You may shower.  Do not sign any important legal documents or operate any machinery for 24 hours (because of the anesthesia used during the test).  NUTRITION Drink plenty of fluids.  You may resume your normal diet.  Begin with a light meal and progress to your normal diet.  Avoid alcoholic beverages for 24 hours or as instructed by your caregiver.  MEDICATIONS You may resume your normal medications unless your caregiver tells you otherwise.  WHAT YOU CAN EXPECT TODAY You may experience abdominal discomfort such as a feeling of fullness or "gas" pains.  FOLLOW-UP Your doctor will discuss the results of your test with you.  SEEK IMMEDIATE MEDICAL ATTENTION IF ANY OF THE FOLLOWING OCCUR: Excessive nausea (feeling sick to your stomach) and/or vomiting.  Severe abdominal pain and distention (swelling).  Trouble swallowing.  Temperature over 101 F (37.8 C).  Rectal bleeding or vomiting of blood.   Your upper endoscopy revealed a large amount of food in your stomach and small bowel.  Esophagus appeared normal.  Incomplete procedure due to poor visualization today.   This does raise the suspicion for possible delayed gastric emptying (gastroparesis).  Continue on pantoprazole  daily.  Follow-up in GI office in 2 to 3 months.  Office will notify you.   I hope you have a great rest of your week!  Carlin POUR. Cindie, D.O. Gastroenterology and Hepatology [facility]

## 2024-07-23 NOTE — Anesthesia Preprocedure Evaluation (Signed)
 Anesthesia Evaluation  Patient identified by MRN, date of birth, ID band Patient awake    Reviewed: Allergy & Precautions, H&P , NPO status , Patient's Chart, lab work & pertinent test results, reviewed documented beta blocker date and time   Airway Mallampati: II  TM Distance: >3 FB Neck ROM: full    Dental no notable dental hx.    Pulmonary neg pulmonary ROS   Pulmonary exam normal breath sounds clear to auscultation       Cardiovascular Exercise Tolerance: Good hypertension,  Rhythm:regular Rate:Normal     Neuro/Psych  PSYCHIATRIC DISORDERS Anxiety Depression    negative neurological ROS     GI/Hepatic Neg liver ROS,GERD  ,,  Endo/Other  negative endocrine ROS    Renal/GU negative Renal ROS  negative genitourinary   Musculoskeletal   Abdominal   Peds  Hematology negative hematology ROS (+)   Anesthesia Other Findings   Reproductive/Obstetrics negative OB ROS                              Anesthesia Physical Anesthesia Plan  ASA: 2  Anesthesia Plan: General   Post-op Pain Management:    Induction:   PONV Risk Score and Plan: Propofol  infusion  Airway Management Planned:   Additional Equipment:   Intra-op Plan:   Post-operative Plan:   Informed Consent: I have reviewed the patients History and Physical, chart, labs and discussed the procedure including the risks, benefits and alternatives for the proposed anesthesia with the patient or authorized representative who has indicated his/her understanding and acceptance.     Dental Advisory Given  Plan Discussed with: CRNA  Anesthesia Plan Comments:         Anesthesia Quick Evaluation

## 2024-07-23 NOTE — Transfer of Care (Signed)
 Immediate Anesthesia Transfer of Care Note  Patient: Rhonda Morales  Procedure(s) Performed: EGD (ESOPHAGOGASTRODUODENOSCOPY)  Patient Location: Endoscopy Unit  Anesthesia Type:General  Level of Consciousness: awake  Airway & Oxygen Therapy: Patient Spontanous Breathing  Post-op Assessment: Report given to RN and Post -op Vital signs reviewed and stable  Post vital signs: Reviewed and stable  Last Vitals:  Vitals Value Taken Time  BP 105/43 07/23/24 12:42  Temp 36.5 C 07/23/24 12:42  Pulse 66 07/23/24 12:42  Resp 13 07/23/24 12:42  SpO2 92 % 07/23/24 12:42    Last Pain:  Vitals:   07/23/24 1242  TempSrc: Oral  PainSc: 0-No pain      Patients Stated Pain Goal: 2 (07/23/24 1138)  Complications: No notable events documented.

## 2024-07-23 NOTE — Op Note (Signed)
 [facility] Patient Name: Rhonda Morales Procedure Date: 07/23/2024 12:29 PM MRN: [id number] Date of Birth: 1951/06/08 Attending MD: Carlin POUR. Cindie , [place], [id number] CSN: [id number] Age: 73 Admit Type: Outpatient Procedure:                Upper GI endoscopy Indications:              Epigastric abdominal pain, Chest pain (non cardiac) Providers:                Carlin POUR. Cindie, DO, Leandrew Edelman RN, RN, Dorcas Lenis, Technician Referring MD:              Medicines:                See the Anesthesia note for documentation of the                            administered medications Complications:            No immediate complications. Estimated Blood Loss:     Estimated blood loss: none. Procedure:                Pre-Anesthesia Assessment:                           - The anesthesia plan was to use monitored                            anesthesia care (MAC).                           After obtaining informed consent, the endoscope was                            passed under direct vision. Throughout the                            procedure, the patient's blood pressure, pulse, and                            oxygen saturations were monitored continuously. The                            [id number] ([id number]) Upper was introduced through                            the mouth, and advanced to the second part of                            duodenum. The upper GI endoscopy was accomplished                            without difficulty. The patient tolerated the                            procedure  well. Scope In: 12:37:55 PM Scope Out: 12:39:23 PM Total Procedure Duration: 0 hours 1 minute 28 seconds  Findings:      The examined esophagus was normal.      A large amount of food (residue) was found in the gastric body.      Food (residue) was found in the duodenal bulb. Impression:               - Normal esophagus.                           - A large amount of food  (residue) in the stomach.                           - Retained food in the duodenum.                           - No specimens collected. Moderate Sedation:      Per Anesthesia Care Recommendation:           - Patient has a contact number available for                            emergencies. The signs and symptoms of potential                            delayed complications were discussed with the                            patient. Return to normal activities tomorrow.                            Written discharge instructions were provided to the                            patient.                           - Resume previous diet.                           - Continue present medications.                           - Return to GI clinic in 3 months.                           - Use a proton pump inhibitor PO daily.                           - Large amount of food in stomach and small bowel.                            Procedure aborted due to aspiration risk. Stomach                            not fully evaluated. This does raise suspicion  for                            possible underlying gastroparesis. Consider further                            workup if symptomatic. Consider repeat EGD due to                            incomplete (poor visualization) procedure today.                            Recommend clear liquids entire day prior to                            procedure. Procedure Code(s):        --- Professional ---                           [phone], Esophagogastroduodenoscopy, flexible,                            transoral; diagnostic, including collection of                            specimen(s) by brushing or washing, when performed                            (separate procedure) Diagnosis Code(s):        --- Professional ---                           R10.13, Epigastric pain                           R07.89, Other chest pain CPT copyright 2022 American Medical Association. All  rights reserved. The codes documented in this report are preliminary and upon coder review may  be revised to meet current compliance requirements. Carlin POUR. Cindie, DO Carlin POUR. Cindie, DO 07/23/2024 12:42:40 PM This report has been signed electronically. Number of Addenda: 0

## 2024-07-23 NOTE — Interval H&P Note (Signed)
 History and Physical Interval Note:  07/23/2024 12:24 PM  Rhonda Morales  has presented today for surgery, with the diagnosis of EPIGASTRIC PAIN, CHEST PAIN.  The various methods of treatment have been discussed with the patient and family. After consideration of risks, benefits and other options for treatment, the patient has consented to  Procedure(s) with comments: EGD (ESOPHAGOGASTRODUODENOSCOPY) (N/A) - 1:00pm, asa 2 as a surgical intervention.  The patient's history has been reviewed, patient examined, no change in status, stable for surgery.  I have reviewed the patient's chart and labs.  Questions were answered to the patient's satisfaction.     Rhonda Morales

## 2024-07-31 NOTE — Anesthesia Postprocedure Evaluation (Signed)
 Anesthesia Post Note  Patient: Rhonda Morales  Procedure(s) Performed: EGD (ESOPHAGOGASTRODUODENOSCOPY)  Patient location during evaluation: Phase II Anesthesia Type: General Level of consciousness: awake Pain management: pain level controlled Vital Signs Assessment: post-procedure vital signs reviewed and stable Respiratory status: spontaneous breathing and respiratory function stable Cardiovascular status: blood pressure returned to baseline and stable Postop Assessment: no headache and no apparent nausea or vomiting Anesthetic complications: no Comments: Late entry   No notable events documented.   Last Vitals:  Vitals:   07/23/24 1138 07/23/24 1242  BP:  (!) 105/43  Pulse: 70 66  Resp: 11 13  Temp: 36.7 C 36.5 C  SpO2:  92%    Last Pain:  Vitals:   07/23/24 1242  TempSrc: Oral  PainSc: 0-No pain                 Yvonna JINNY Bosworth

## 2024-08-20 ENCOUNTER — Ambulatory Visit: Admitting: Urology

## 2024-09-09 ENCOUNTER — Encounter (HOSPITAL_COMMUNITY): Admission: EM | Disposition: A | Discharge status: Rehabilitation Facility | Payer: Self-pay | Source: Home / Self Care

## 2024-09-09 ENCOUNTER — Inpatient Hospital Stay (HOSPITAL_COMMUNITY): Admitting: Anesthesiology

## 2024-09-09 ENCOUNTER — Inpatient Hospital Stay (HOSPITAL_COMMUNITY)
Admission: EM | Admit: 2024-09-09 | Discharge: 2024-09-13 | DRG: 464 | Disposition: A | Discharge status: Rehabilitation Facility | Attending: Orthopedic Surgery | Admitting: Orthopedic Surgery

## 2024-09-09 ENCOUNTER — Emergency Department (HOSPITAL_COMMUNITY)

## 2024-09-09 ENCOUNTER — Other Ambulatory Visit: Payer: Self-pay

## 2024-09-09 ENCOUNTER — Inpatient Hospital Stay (HOSPITAL_COMMUNITY)

## 2024-09-09 ENCOUNTER — Encounter (HOSPITAL_COMMUNITY): Payer: Self-pay

## 2024-09-09 DIAGNOSIS — S82899B Other fracture of unspecified lower leg, initial encounter for open fracture type I or II: Secondary | ICD-10-CM | POA: Diagnosis present

## 2024-09-09 DIAGNOSIS — S9304XA Dislocation of right ankle joint, initial encounter: Secondary | ICD-10-CM

## 2024-09-09 DIAGNOSIS — T148XXA Other injury of unspecified body region, initial encounter: Principal | ICD-10-CM

## 2024-09-09 DIAGNOSIS — S73005A Unspecified dislocation of left hip, initial encounter: Secondary | ICD-10-CM

## 2024-09-09 DIAGNOSIS — T07XXXA Unspecified multiple injuries, initial encounter: Secondary | ICD-10-CM

## 2024-09-09 DIAGNOSIS — S82891B Other fracture of right lower leg, initial encounter for open fracture type I or II: Secondary | ICD-10-CM

## 2024-09-09 DIAGNOSIS — S62613B Displaced fracture of proximal phalanx of left middle finger, initial encounter for open fracture: Secondary | ICD-10-CM | POA: Diagnosis present

## 2024-09-09 DIAGNOSIS — S82841C Displaced bimalleolar fracture of right lower leg, initial encounter for open fracture type IIIA, IIIB, or IIIC: Principal | ICD-10-CM | POA: Diagnosis present

## 2024-09-09 DIAGNOSIS — I10 Essential (primary) hypertension: Secondary | ICD-10-CM | POA: Diagnosis present

## 2024-09-09 DIAGNOSIS — S82842F Displaced bimalleolar fracture of left lower leg, subsequent encounter for open fracture type IIIA, IIIB, or IIIC with routine healing: Secondary | ICD-10-CM | POA: Diagnosis not present

## 2024-09-09 DIAGNOSIS — M4856XA Collapsed vertebra, not elsewhere classified, lumbar region, initial encounter for fracture: Secondary | ICD-10-CM | POA: Diagnosis present

## 2024-09-09 DIAGNOSIS — Y92410 Unspecified street and highway as the place of occurrence of the external cause: Secondary | ICD-10-CM | POA: Diagnosis not present

## 2024-09-09 DIAGNOSIS — M7989 Other specified soft tissue disorders: Secondary | ICD-10-CM | POA: Diagnosis not present

## 2024-09-09 DIAGNOSIS — N3010 Interstitial cystitis (chronic) without hematuria: Secondary | ICD-10-CM | POA: Diagnosis present

## 2024-09-09 DIAGNOSIS — S62315A Displaced fracture of base of fourth metacarpal bone, left hand, initial encounter for closed fracture: Secondary | ICD-10-CM | POA: Diagnosis present

## 2024-09-09 DIAGNOSIS — K219 Gastro-esophageal reflux disease without esophagitis: Secondary | ICD-10-CM | POA: Diagnosis present

## 2024-09-09 DIAGNOSIS — E785 Hyperlipidemia, unspecified: Secondary | ICD-10-CM | POA: Diagnosis present

## 2024-09-09 DIAGNOSIS — G8918 Other acute postprocedural pain: Secondary | ICD-10-CM | POA: Diagnosis not present

## 2024-09-09 DIAGNOSIS — S73005D Unspecified dislocation of left hip, subsequent encounter: Secondary | ICD-10-CM | POA: Diagnosis not present

## 2024-09-09 DIAGNOSIS — S82841E Displaced bimalleolar fracture of right lower leg, subsequent encounter for open fracture type I or II with routine healing: Secondary | ICD-10-CM | POA: Diagnosis not present

## 2024-09-09 DIAGNOSIS — K5901 Slow transit constipation: Secondary | ICD-10-CM | POA: Diagnosis not present

## 2024-09-09 DIAGNOSIS — R52 Pain, unspecified: Secondary | ICD-10-CM | POA: Diagnosis not present

## 2024-09-09 HISTORY — PX: IRRIGATION AND DEBRIDEMENT POSTERIOR HIP: SHX7265

## 2024-09-09 HISTORY — PX: CLOSED REDUCTION FINGER WITH PERCUTANEOUS PINNING: SHX5612

## 2024-09-09 HISTORY — DX: Hyperlipidemia, unspecified: E78.5

## 2024-09-09 HISTORY — DX: Interstitial cystitis (chronic) without hematuria: N30.10

## 2024-09-09 HISTORY — DX: Conductive hearing loss, unilateral, left ear, with unrestricted hearing on the contralateral side: H90.12

## 2024-09-09 HISTORY — PX: INCISION AND DRAINAGE OF WOUND: SHX1803

## 2024-09-09 HISTORY — DX: Low back pain, unspecified: M54.50

## 2024-09-09 HISTORY — DX: Unspecified hearing loss, bilateral: H91.93

## 2024-09-09 HISTORY — PX: PERCUTANEOUS PINNING: SHX2209

## 2024-09-09 HISTORY — DX: Essential (primary) hypertension: I10

## 2024-09-09 HISTORY — DX: Gastro-esophageal reflux disease without esophagitis: K21.9

## 2024-09-09 HISTORY — PX: EXTERNAL FIXATION, ANKLE: SHX7570

## 2024-09-09 HISTORY — DX: Unspecified osteoarthritis, unspecified site: M19.90

## 2024-09-09 HISTORY — DX: Major depressive disorder, single episode, mild: F32.0

## 2024-09-09 HISTORY — DX: Weakness: R53.1

## 2024-09-09 HISTORY — DX: Benign neoplasm of cerebral meninges: D32.0

## 2024-09-09 HISTORY — DX: Conductive hearing loss, unspecified: H90.2

## 2024-09-09 LAB — COMPREHENSIVE METABOLIC PANEL WITH GFR
ALT: 144 U/L — ABNORMAL HIGH (ref 0–44)
AST: 374 U/L — ABNORMAL HIGH (ref 15–41)
Albumin: 3.1 g/dL — ABNORMAL LOW (ref 3.5–5.0)
Alkaline Phosphatase: 84 U/L (ref 38–126)
Anion gap: 10 (ref 5–15)
BUN: 9 mg/dL (ref 8–23)
CO2: 23 mmol/L (ref 22–32)
Calcium: 8.3 mg/dL — ABNORMAL LOW (ref 8.9–10.3)
Chloride: 104 mmol/L (ref 98–111)
Creatinine, Ser: 0.89 mg/dL (ref 0.44–1.00)
GFR, Estimated: >60 mL/min (ref >60)
Glucose, Bld: 147 mg/dL — ABNORMAL HIGH (ref 70–99)
Potassium: 4.2 mmol/L (ref 3.5–5.1)
Sodium: 137 mmol/L (ref 135–145)
Total Bilirubin: 0.8 mg/dL (ref 0.0–1.2)
Total Protein: 6 g/dL — ABNORMAL LOW (ref 6.5–8.1)

## 2024-09-09 LAB — PROTIME-INR
INR: 0.9 (ref 0.8–1.2)
Prothrombin Time: 12.9 s (ref 11.4–15.2)

## 2024-09-09 LAB — CBC
HCT: 38.9 % (ref 36.0–46.0)
Hemoglobin: 12.4 g/dL (ref 12.0–15.0)
MCH: 27.7 pg (ref 26.0–34.0)
MCHC: 31.9 g/dL (ref 30.0–36.0)
MCV: 86.8 fL (ref 80.0–100.0)
Platelets: 221 K/uL (ref 150–400)
RBC: 4.48 MIL/uL (ref 3.87–5.11)
RDW: 13 % (ref 11.5–15.5)
WBC: 13.5 K/uL — ABNORMAL HIGH (ref 4.0–10.5)
nRBC: 0 % (ref 0.0–0.2)

## 2024-09-09 LAB — I-STAT CHEM 8, ED
BUN: 11 mg/dL (ref 8–23)
Calcium, Ion: 1.1 mmol/L — ABNORMAL LOW (ref 1.15–1.40)
Chloride: 105 mmol/L (ref 98–111)
Creatinine, Ser: 0.8 mg/dL (ref 0.44–1.00)
Glucose, Bld: 150 mg/dL — ABNORMAL HIGH (ref 70–99)
HCT: 37 % (ref 36.0–46.0)
Hemoglobin: 12.6 g/dL (ref 12.0–15.0)
Potassium: 4.4 mmol/L (ref 3.5–5.1)
Sodium: 141 mmol/L (ref 135–145)
TCO2: 25 mmol/L (ref 22–32)

## 2024-09-09 LAB — I-STAT CG4 LACTIC ACID, ED: Lactic Acid, Venous: 2.8 mmol/L (ref 0.5–1.9)

## 2024-09-09 LAB — SAMPLE TO BLOOD BANK

## 2024-09-09 LAB — ETHANOL: Alcohol, Ethyl (B): <15 mg/dL (ref <15)

## 2024-09-09 SURGERY — IRRIGATION AND DEBRIDEMENT, OPEN FRACTURE
Anesthesia: General | Site: Finger | Laterality: Right

## 2024-09-09 MED ORDER — DEXAMETHASONE SOD PHOSPHATE PF 10 MG/ML IJ SOLN
INTRAMUSCULAR | Status: DC | PRN
Start: 2024-09-09 — End: 2024-09-09
  Administered 2024-09-09 (×2): 10 mg via INTRAVENOUS

## 2024-09-09 MED ORDER — SODIUM CHLORIDE 0.9 % IV BOLUS
1000.0000 mL | Freq: Once | INTRAVENOUS | Status: AC
Start: 2024-09-09 — End: 2024-09-09
  Administered 2024-09-09 (×2): 1000 mL via INTRAVENOUS

## 2024-09-09 MED ORDER — PROPOFOL 10 MG/ML IV BOLUS
0.5000 mg/kg | Freq: Once | INTRAVENOUS | Status: DC
  Filled 2024-09-09: qty 20

## 2024-09-09 MED ORDER — ONDANSETRON HCL 4 MG/2ML IJ SOLN
INTRAMUSCULAR | Status: DC | PRN
  Administered 2024-09-09 (×2): 4 mg via INTRAVENOUS

## 2024-09-09 MED ORDER — SUCCINYLCHOLINE CHLORIDE 200 MG/10ML IV SOSY
PREFILLED_SYRINGE | INTRAVENOUS | Status: DC | PRN
  Administered 2024-09-09 (×2): 120 mg via INTRAVENOUS

## 2024-09-09 MED ORDER — FENTANYL CITRATE (PF) 50 MCG/ML IJ SOSY
PREFILLED_SYRINGE | INTRAMUSCULAR | Status: AC
  Administered 2024-09-09 (×2): 50 ug via INTRAVENOUS
  Filled 2024-09-09: qty 1

## 2024-09-09 MED ORDER — FENTANYL CITRATE (PF) 50 MCG/ML IJ SOSY: 50.0000 ug | PREFILLED_SYRINGE | Freq: Once | INTRAMUSCULAR | Status: AC

## 2024-09-09 MED ORDER — IOHEXOL 350 MG/ML SOLN
75.0000 mL | Freq: Once | INTRAVENOUS | Status: AC | PRN
  Administered 2024-09-09 (×2): 75 mL via INTRAVENOUS

## 2024-09-09 MED ORDER — FENTANYL CITRATE (PF) 100 MCG/2ML IJ SOLN: 25.0000 ug | INTRAMUSCULAR | Status: DC | PRN

## 2024-09-09 MED ORDER — LACTATED RINGERS IV SOLN: INTRAVENOUS | Status: DC | PRN

## 2024-09-09 MED ORDER — FENTANYL CITRATE (PF) 250 MCG/5ML IJ SOLN
INTRAMUSCULAR | Status: DC | PRN
  Administered 2024-09-09 (×6): 50 ug via INTRAVENOUS

## 2024-09-09 MED ORDER — PHENYLEPHRINE HCL-NACL 20-0.9 MG/250ML-% IV SOLN
INTRAVENOUS | Status: DC | PRN
  Administered 2024-09-09 (×2): 40 ug/min via INTRAVENOUS

## 2024-09-09 MED ORDER — EPHEDRINE SULFATE-NACL 50-0.9 MG/10ML-% IV SOSY
PREFILLED_SYRINGE | INTRAVENOUS | Status: DC | PRN
  Administered 2024-09-09 (×2): 160 mg via INTRAVENOUS

## 2024-09-09 MED ORDER — CEFAZOLIN SODIUM-DEXTROSE 2-3 GM-%(50ML) IV SOLR
INTRAVENOUS | Status: DC | PRN
  Administered 2024-09-09 (×2): 2 g via INTRAVENOUS

## 2024-09-09 MED ORDER — HYDROMORPHONE HCL 1 MG/ML IJ SOLN
INTRAMUSCULAR | Status: AC
  Administered 2024-09-09 (×2): 0.5 mg
  Filled 2024-09-09: qty 1

## 2024-09-09 MED ORDER — PROPOFOL 10 MG/ML IV BOLUS
INTRAVENOUS | Status: AC
  Filled 2024-09-09: qty 20

## 2024-09-09 MED ORDER — ALBUMIN HUMAN 5 % IV SOLN: INTRAVENOUS | Status: DC | PRN

## 2024-09-09 MED ORDER — ACETAMINOPHEN 10 MG/ML IV SOLN
INTRAVENOUS | Status: DC | PRN
  Administered 2024-09-09 (×2): 1000 mg via INTRAVENOUS

## 2024-09-09 MED ORDER — TETANUS-DIPHTH-ACELL PERTUSSIS 5-2-15.5 LF-MCG/0.5 IM SUSP
0.5000 mL | Freq: Once | INTRAMUSCULAR | Status: AC
  Administered 2024-09-09 (×2): 0.5 mL via INTRAMUSCULAR

## 2024-09-09 MED ORDER — LIDOCAINE 2% (20 MG/ML) 5 ML SYRINGE
INTRAMUSCULAR | Status: DC | PRN
  Administered 2024-09-09 (×2): 40 mg via INTRAVENOUS

## 2024-09-09 MED ORDER — PROPOFOL 10 MG/ML IV BOLUS
INTRAVENOUS | Status: AC | PRN
  Administered 2024-09-09 (×2): 10 mg via INTRAVENOUS

## 2024-09-09 MED ORDER — FENTANYL CITRATE (PF) 250 MCG/5ML IJ SOLN
INTRAMUSCULAR | Status: AC
  Filled 2024-09-09: qty 5

## 2024-09-09 MED ORDER — PHENYLEPHRINE 80 MCG/ML (10ML) SYRINGE FOR IV PUSH (FOR BLOOD PRESSURE SUPPORT)
PREFILLED_SYRINGE | INTRAVENOUS | Status: DC | PRN
  Administered 2024-09-09 (×2): 240 ug via INTRAVENOUS

## 2024-09-09 MED ORDER — PROPOFOL 10 MG/ML IV BOLUS
INTRAVENOUS | Status: DC | PRN
  Administered 2024-09-09 (×2): 100 mg via INTRAVENOUS

## 2024-09-09 MED ORDER — ROCURONIUM BROMIDE 10 MG/ML (PF) SYRINGE
PREFILLED_SYRINGE | INTRAVENOUS | Status: DC | PRN
  Administered 2024-09-09: 20 mg via INTRAVENOUS
  Administered 2024-09-09: 10 mg via INTRAVENOUS
  Administered 2024-09-09: 50 mg via INTRAVENOUS
  Administered 2024-09-09: 20 mg via INTRAVENOUS
  Administered 2024-09-09: 10 mg via INTRAVENOUS
  Administered 2024-09-09: 50 mg via INTRAVENOUS

## 2024-09-09 MED ORDER — CEFAZOLIN SODIUM-DEXTROSE 2-4 GM/100ML-% IV SOLN
2.0000 g | Freq: Once | INTRAVENOUS | Status: AC
  Administered 2024-09-09 (×2): 2 g via INTRAVENOUS

## 2024-09-09 MED ORDER — ACETAMINOPHEN 10 MG/ML IV SOLN
INTRAVENOUS | Status: AC
  Filled 2024-09-09: qty 100

## 2024-09-09 MED ORDER — SUGAMMADEX SODIUM 200 MG/2ML IV SOLN
INTRAVENOUS | Status: DC | PRN
  Administered 2024-09-09 (×2): 200 mg via INTRAVENOUS

## 2024-09-09 MED ORDER — BUPIVACAINE HCL (PF) 0.25 % IJ SOLN
INTRAMUSCULAR | Status: DC | PRN
  Administered 2024-09-09: 9 mL
  Administered 2024-09-09: 21 mL
  Administered 2024-09-09: 9 mL
  Administered 2024-09-09: 21 mL

## 2024-09-09 MED ORDER — CEFAZOLIN SODIUM-DEXTROSE 2-4 GM/100ML-% IV SOLN
INTRAVENOUS | Status: AC
  Filled 2024-09-09: qty 100

## 2024-09-09 MED ORDER — FENTANYL CITRATE (PF) 50 MCG/ML IJ SOSY
PREFILLED_SYRINGE | INTRAMUSCULAR | Status: AC
  Administered 2024-09-09 (×2): 100 ug via INTRAVENOUS
  Filled 2024-09-09: qty 2

## 2024-09-09 MED ORDER — SODIUM CHLORIDE 0.9 % IR SOLN
Status: DC | PRN
  Administered 2024-09-09 (×2): 6000 mL

## 2024-09-09 SURGICAL SUPPLY — 59 items
BIT DRILL SHORT 2.5 (BIT) ×1 IMPLANT
BNDG COMPR ESMARK 4X3 LF (GAUZE/BANDAGES/DRESSINGS) ×4 IMPLANT
BNDG ELASTIC 3INX 5YD STR LF (GAUZE/BANDAGES/DRESSINGS) ×5 IMPLANT
BNDG ELASTIC 6INX 5YD STR LF (GAUZE/BANDAGES/DRESSINGS) ×5 IMPLANT
BNDG GAUZE DERMACEA FLUFF 4 (GAUZE/BANDAGES/DRESSINGS) ×4 IMPLANT
CORD BIPOLAR FORCEPS 12FT (ELECTRODE) ×1 IMPLANT
COVER SURGICAL LIGHT HANDLE (MISCELLANEOUS) ×4 IMPLANT
CUFF TOURN SGL QUICK 18X4 (TOURNIQUET CUFF) ×1 IMPLANT
CUFF TRNQT CYL 34X4.125X (TOURNIQUET CUFF) ×7 IMPLANT
DRAPE C-ARM 42X72 X-RAY (DRAPES) ×1 IMPLANT
DRAPE C-ARMOR (DRAPES) ×4 IMPLANT
DRAPE OEC MINIVIEW 54X84 (DRAPES) ×7 IMPLANT
DRAPE SURG 17X23 STRL (DRAPES) ×1 IMPLANT
DRAPE U-SHAPE 47X51 STRL (DRAPES) ×4 IMPLANT
DRIVER RETENTION T15 LONG (ORTHOPEDIC DISPOSABLE SUPPLIES) ×2 IMPLANT
ELECT CAUTERY BLADE 6.4 (BLADE) ×4 IMPLANT
ELECTRODE REM PT RTRN 9FT ADLT (ELECTROSURGICAL) ×4 IMPLANT
FIXATION ZIPTIGHT ANKLE SNDSMS (Ankle) ×1 IMPLANT
GAUZE PAD ABD 8X10 STRL (GAUZE/BANDAGES/DRESSINGS) ×6 IMPLANT
GAUZE SPONGE 4X4 12PLY STRL (GAUZE/BANDAGES/DRESSINGS) ×8 IMPLANT
GAUZE XEROFORM 1X8 LF (GAUZE/BANDAGES/DRESSINGS) ×4 IMPLANT
GAUZE XEROFORM 5X9 LF (GAUZE/BANDAGES/DRESSINGS) ×4 IMPLANT
GLOVE BIO SURGEON STRL SZ7.5 (GLOVE) ×12 IMPLANT
GLOVE BIOGEL PI IND STRL 8 (GLOVE) ×8 IMPLANT
GOWN STRL REUS W/ TWL LRG LVL3 (GOWN DISPOSABLE) ×7 IMPLANT
GOWN STRL REUS W/ TWL XL LVL3 (GOWN DISPOSABLE) ×13 IMPLANT
KIT BASIN OR (CUSTOM PROCEDURE TRAY) ×4 IMPLANT
KIT TURNOVER KIT B (KITS) ×4 IMPLANT
KWIRE 0.9X102 (Wire) ×3 IMPLANT
KWIRE ALPS MXV 1.6X6 ZI (WIRE) ×4 IMPLANT
MANIFOLD NEPTUNE II (INSTRUMENTS) ×4 IMPLANT
NDL HYPO 22X1.5 SAFETY MO (MISCELLANEOUS) IMPLANT
NEEDLE HYPO 22X1.5 SAFETY MO (MISCELLANEOUS) ×8 IMPLANT
PACK ORTHO EXTREMITY (CUSTOM PROCEDURE TRAY) ×5 IMPLANT
PAD ARMBOARD POSITIONER FOAM (MISCELLANEOUS) ×7 IMPLANT
PADDING CAST COTTON 6X4 STRL (CAST SUPPLIES) ×11 IMPLANT
PLATE LAT FIB 6H RT (Plate) ×1 IMPLANT
SCREW LOCK MDS 3.5X14 (Screw) ×2 IMPLANT
SCREW LOCK MDS 3.5X16 (Screw) ×4 IMPLANT
SCREW NLOCK 3.5X46 (Screw) ×1 IMPLANT
SCREW NLOCK ALPS 3.5X14 (Screw) ×1 IMPLANT
SCREW NON-LOCK 3.5X12 (Screw) ×1 IMPLANT
SET CYSTO W/LG BORE CLAMP LF (SET/KITS/TRAYS/PACK) ×4 IMPLANT
SET HNDPC FAN SPRY TIP SCT (DISPOSABLE) ×1 IMPLANT
SOLN 0.9% NACL 1000 ML (IV SOLUTION) ×8 IMPLANT
SOLN 0.9% NACL POUR BTL 1000ML (IV SOLUTION) ×8 IMPLANT
SPONGE T-LAP 18X18 ~~LOC~~+RFID (SPONGE) ×8 IMPLANT
STAPLER SKIN PROX 35W (STAPLE) ×1 IMPLANT
SUT ETHILON 2 0 FS 18 (SUTURE) ×4 IMPLANT
SUT MON AB 2-0 CT1 36 (SUTURE) ×2 IMPLANT
SUT MON AB 5-0 PS2 18 (SUTURE) ×5 IMPLANT
SUT PDS AB 2-0 CT1 27 (SUTURE) ×2 IMPLANT
SYR CONTROL 10ML LL (SYRINGE) ×2 IMPLANT
TOWEL GREEN STERILE (TOWEL DISPOSABLE) ×7 IMPLANT
TOWEL GREEN STERILE FF (TOWEL DISPOSABLE) ×7 IMPLANT
TRAY FOL W/BAG SLVR 16FR STRL (SET/KITS/TRAYS/PACK) ×1 IMPLANT
TUBE CONNECTING 12X1/4 (SUCTIONS) ×5 IMPLANT
UNDERPAD 30X36 HEAVY ABSORB (UNDERPADS AND DIAPERS) ×8 IMPLANT
YANKAUER SUCT BULB TIP NO VENT (SUCTIONS) ×4 IMPLANT

## 2024-09-09 NOTE — Anesthesia Preprocedure Evaluation (Addendum)
 Anesthesia Evaluation   Patient confused    Reviewed: Unable to perform ROS - Chart review onlyPreop documentation limited or incomplete due to emergent nature of procedure.  Airway Mallampati: III  TM Distance: >3 FB Neck ROM: Full    Dental  (+) Teeth Intact, Dental Advisory Given   Pulmonary     + decreased breath sounds      Cardiovascular  Rhythm:Regular Rate:Normal     Neuro/Psych    GI/Hepatic   Endo/Other    Renal/GU      Musculoskeletal   Abdominal   Peds  Hematology   Anesthesia Other Findings   Reproductive/Obstetrics                              Anesthesia Physical Anesthesia Plan  ASA: 3 and emergent  Anesthesia Plan: General   Post-op Pain Management: Ofirmev IV (intra-op)*   Induction: Intravenous, Rapid sequence and Cricoid pressure planned  PONV Risk Score and Plan: 4 or greater and Ondansetron  Airway Management Planned: Oral ETT  Additional Equipment: None  Intra-op Plan:   Post-operative Plan: Extubation in OR  Informed Consent:      History available from chart only and Only emergency history available  Plan Discussed with: CRNA  Anesthesia Plan Comments: (Attempted to call Christopher armin Boas.)         Anesthesia Quick Evaluation

## 2024-09-09 NOTE — Brief Op Note (Signed)
 09/09/2024  11:03 PM  PATIENT:  Rhonda Morales  73 y.o. female  PRE-OPERATIVE DIAGNOSIS:  right open ankle fracture, left long finger open fracture Right traumatic knee wound  POST-OPERATIVE DIAGNOSIS:  right open ankle fracture, left long finger open fracture Right traumatic knee wound  PROCEDURE:  Procedure(s) with comments: IRRIGATION AND DEBRIDEMENT RIGHT ANKLE OPEN FRACTURE (Right) - RIGHT KNEE complex closure of wound measuring 15 cm,  AND I and D of open ANKLE fracture OPEN REDUCTION INTERNAL FIXATION, RIGHT ANKLE (Right) IRRIGATION AND DEBRIDEMENT LEFT LONG FINGER OPEN WOUND (Left) - LEFT LONG FINGER PERCUTANEOUS PINNING LEFT LONG FINGER (Left) - LEFT LONG FINGER  SURGEON:  Surgeons and Role: Panel 1:    * Sharl Selinda Dover, MD - Primary Panel 2:    * Murrell Drivers, MD - Primary  PHYSICIAN ASSISTANT: Dayle Moores, PA-C  ANESTHESIA:   local and general  EBL:  75 mL   BLOOD ADMINISTERED:none  DRAINS: none   LOCAL MEDICATIONS USED:  MARCAINE     SPECIMEN:  No Specimen  DISPOSITION OF SPECIMEN:  N/A  COUNTS:  YES  TOURNIQUET:   Total Tourniquet Time Documented: Thigh (Right) - 65 minutes Thigh (Right) - 59 minutes Total: Thigh (Right) - 124 minutes   DICTATION: .Note written in EPIC  PLAN OF CARE: Admit to inpatient   PATIENT DISPOSITION:  PACU - hemodynamically stable.   Delay start of Pharmacological VTE agent (>24hrs) due to surgical blood loss or risk of bleeding: not applicable

## 2024-09-09 NOTE — ED Notes (Signed)
Transported to CT2 

## 2024-09-09 NOTE — Anesthesia Procedure Notes (Signed)
 Procedure Name: Intubation Date/Time: 09/09/2024 8:53 PM  Performed by: Roddie Grate, CRNAPre-anesthesia Checklist: Patient identified, Emergency Drugs available, Suction available, Patient being monitored and Timeout performed Patient Re-evaluated:Patient Re-evaluated prior to induction Oxygen Delivery Method: Circle system utilized Preoxygenation: Pre-oxygenation with 100% oxygen Induction Type: IV induction, Rapid sequence and Cricoid Pressure applied Laryngoscope Size: Mac and 3 Grade View: Grade II Tube type: Oral Tube size: 7.0 mm Number of attempts: 1 Airway Equipment and Method: Stylet Placement Confirmation: ETT inserted through vocal cords under direct vision, positive ETCO2 and breath sounds checked- equal and bilateral Secured at: 20 cm Tube secured with: Tape Dental Injury: Teeth and Oropharynx as per pre-operative assessment  Comments: Smooth IV Induction. Eyes taped. RSI Performed. DL x 1 with grade 2a view. Atraumatically placed, teeth and lip remain intact as pre-op. Secured with tape. Bilateral breath sounds +/=, EtCO2 +, Adequate TV, VSS.
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
## 2024-09-09 NOTE — ED Notes (Signed)
 Ortho at bedside.

## 2024-09-09 NOTE — Progress Notes (Signed)
 Transition of Care [facility]) - CAGE-AID Screening   Patient Details  Name: Khia Dieterich MRN: [id number] Date of Birth: [birth year]  Transition of Care [facility]) CM/SW Contact:    Sallyanne MALVA Mettle, RN Phone Number: 09/09/2024, 9:04 PM   Clinical Narrative:  Pt unable to participate in screening, ALOC. Unclear baseline per primary RN Alan, family has gone home for the evening.  CAGE-AID Screening: Substance Abuse Screening unable to be completed due to: : Patient unable to participate (altered)

## 2024-09-09 NOTE — ED Notes (Signed)
 Xray at beside

## 2024-09-09 NOTE — Transfer of Care (Signed)
 Immediate Anesthesia Transfer of Care Note  Patient: Rhonda Morales  Procedure(s) Performed: IRRIGATION AND DEBRIDEMENT RIGHT ANKLE OPEN FRACTURE (Right: Ankle) OPEN REDUCTION INTERNAL FIXATION, RIGHT ANKLE (Right: Ankle) IRRIGATION AND DEBRIDEMENT LEFT LONG FINGER OPEN WOUND (Left: Finger) PERCUTANEOUS PINNING LEFT LONG FINGER (Left: Finger) CLOSED REDUCTION, FINGER, WITH PERCUTANEOUS PINNING RIGHT RING FINGER (Finger)  Patient Location: PACU  Anesthesia Type:General  Level of Consciousness: drowsy and responds to stimulation  Airway & Oxygen Therapy: Patient Spontanous Breathing and Patient connected to face mask oxygen opa in place  Post-op Assessment: Report given to RN and Post -op Vital signs reviewed and stable  Post vital signs: Reviewed and stable  Last Vitals:  Vitals Value Taken Time  BP 192/91 09/09/24 23:17  Temp 36.6 C 09/09/24 23:17  Pulse 83 09/09/24 23:17  Resp 20 09/09/24 23:17  SpO2 100 % 09/09/24 23:17  Vitals shown include unfiled device data.  Last Pain:  Vitals:   09/09/24 1934  TempSrc: Axillary  PainSc:          Complications: No notable events documented.
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
## 2024-09-09 NOTE — Procedures (Signed)
 Procedure: Left hip closed reduction   Indication: Left hip dislocation   Surgeon: Ozell Ned, PA-C   Assist: Hamp Pro, PA-C   Anesthesia: Propofol via EDP   EBL: None   Complications: None   Findings: After risks/benefits explained patient/husband desires to undergo procedure. Consent obtained and time out performed. Sedation given and efficacy confirmed. Hip reduced and KI placed. Pt tolerated the procedure well.       Ozell DOROTHA Ned, PA-C Orthopedic Surgery [phone]
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
## 2024-09-09 NOTE — Sedation Documentation (Signed)
Hip reduced
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
## 2024-09-10 ENCOUNTER — Encounter (HOSPITAL_COMMUNITY): Payer: Self-pay | Admitting: Orthopedic Surgery

## 2024-09-10 ENCOUNTER — Encounter: Payer: Self-pay | Admitting: Internal Medicine

## 2024-09-10 LAB — CBC
HCT: 26.2 % — ABNORMAL LOW (ref 36.0–46.0)
Hemoglobin: 8.5 g/dL — ABNORMAL LOW (ref 12.0–15.0)
MCH: 27.6 pg (ref 26.0–34.0)
MCHC: 32.4 g/dL (ref 30.0–36.0)
MCV: 85.1 fL (ref 80.0–100.0)
Platelets: 111 K/uL — ABNORMAL LOW (ref 150–400)
RBC: 3.08 MIL/uL — ABNORMAL LOW (ref 3.87–5.11)
RDW: 13.3 % (ref 11.5–15.5)
WBC: 6.2 K/uL (ref 4.0–10.5)
nRBC: 0 % (ref 0.0–0.2)

## 2024-09-10 LAB — BASIC METABOLIC PANEL WITH GFR
Anion gap: 9 (ref 5–15)
BUN: 6 mg/dL — ABNORMAL LOW (ref 8–23)
CO2: 23 mmol/L (ref 22–32)
Calcium: 7.1 mg/dL — ABNORMAL LOW (ref 8.9–10.3)
Chloride: 109 mmol/L (ref 98–111)
Creatinine, Ser: 0.59 mg/dL (ref 0.44–1.00)
GFR, Estimated: >60 mL/min (ref >60)
Glucose, Bld: 149 mg/dL — ABNORMAL HIGH (ref 70–99)
Potassium: 3.7 mmol/L (ref 3.5–5.1)
Sodium: 141 mmol/L (ref 135–145)

## 2024-09-10 MED ORDER — SODIUM CHLORIDE 0.9 % IV SOLN
INTRAVENOUS | Status: AC
Start: 2024-09-10 — End: 2024-09-11

## 2024-09-10 MED ORDER — ALPRAZOLAM 0.25 MG PO TABS
0.2500 mg | ORAL_TABLET | Freq: Two times a day (BID) | ORAL | Status: DC | PRN
Start: 2024-09-10 — End: 2024-09-13
  Administered 2024-09-10 – 2024-09-13 (×11): 0.25 mg via ORAL
  Filled 2024-09-10 (×7): qty 1

## 2024-09-10 MED ORDER — METOCLOPRAMIDE HCL 5 MG PO TABS: 5.0000 mg | ORAL_TABLET | Freq: Three times a day (TID) | ORAL | Status: DC | PRN

## 2024-09-10 MED ORDER — HYDRALAZINE HCL 20 MG/ML IJ SOLN
10.0000 mg | INTRAMUSCULAR | Status: DC | PRN
  Filled 2024-09-10: qty 1

## 2024-09-10 MED ORDER — METOPROLOL TARTRATE 5 MG/5ML IV SOLN: 5.0000 mg | Freq: Four times a day (QID) | INTRAVENOUS | Status: DC | PRN

## 2024-09-10 MED ORDER — METHOCARBAMOL 1000 MG/10ML IJ SOLN
500.0000 mg | Freq: Three times a day (TID) | INTRAMUSCULAR | Status: AC
  Administered 2024-09-10 (×2): 500 mg via INTRAVENOUS
  Filled 2024-09-10 (×2): qty 10

## 2024-09-10 MED ORDER — ONDANSETRON HCL 4 MG/2ML IJ SOLN: 4.0000 mg | Freq: Four times a day (QID) | INTRAMUSCULAR | Status: DC | PRN

## 2024-09-10 MED ORDER — CYCLOBENZAPRINE HCL 10 MG PO TABS
10.0000 mg | ORAL_TABLET | Freq: Three times a day (TID) | ORAL | Status: DC | PRN
Start: 2024-09-10 — End: 2024-09-13
  Administered 2024-09-11 – 2024-09-12 (×4): 10 mg via ORAL
  Filled 2024-09-10 (×3): qty 1

## 2024-09-10 MED ORDER — METHOCARBAMOL 500 MG PO TABS
500.0000 mg | ORAL_TABLET | Freq: Three times a day (TID) | ORAL | Status: AC
  Administered 2024-09-10 – 2024-09-12 (×16): 500 mg via ORAL
  Filled 2024-09-10 (×8): qty 1

## 2024-09-10 MED ORDER — ATENOLOL 50 MG PO TABS
50.0000 mg | ORAL_TABLET | Freq: Every day | ORAL | Status: DC
  Administered 2024-09-10 – 2024-09-13 (×7): 50 mg via ORAL
  Filled 2024-09-10 (×4): qty 1

## 2024-09-10 MED ORDER — PANTOPRAZOLE SODIUM 40 MG PO TBEC
40.0000 mg | DELAYED_RELEASE_TABLET | Freq: Every morning | ORAL | Status: DC
  Administered 2024-09-10 – 2024-09-13 (×7): 40 mg via ORAL
  Filled 2024-09-10 (×4): qty 1

## 2024-09-10 MED ORDER — GABAPENTIN 300 MG PO CAPS
300.0000 mg | ORAL_CAPSULE | Freq: Three times a day (TID) | ORAL | Status: DC
  Administered 2024-09-10 – 2024-09-13 (×19): 300 mg via ORAL
  Filled 2024-09-10 (×10): qty 1

## 2024-09-10 MED ORDER — ENOXAPARIN SODIUM 30 MG/0.3ML IJ SOSY
30.0000 mg | PREFILLED_SYRINGE | Freq: Two times a day (BID) | INTRAMUSCULAR | Status: DC
  Administered 2024-09-10 – 2024-09-13 (×13): 30 mg via SUBCUTANEOUS
  Filled 2024-09-10 (×7): qty 0.3

## 2024-09-10 MED ORDER — METOCLOPRAMIDE HCL 5 MG/ML IJ SOLN: 5.0000 mg | Freq: Three times a day (TID) | INTRAMUSCULAR | Status: DC | PRN

## 2024-09-10 MED ORDER — DOCUSATE SODIUM 100 MG PO CAPS
100.0000 mg | ORAL_CAPSULE | Freq: Two times a day (BID) | ORAL | Status: DC
  Administered 2024-09-10 – 2024-09-13 (×13): 100 mg via ORAL
  Filled 2024-09-10 (×7): qty 1

## 2024-09-10 MED ORDER — NITROFURANTOIN MACROCRYSTAL 50 MG PO CAPS
50.0000 mg | ORAL_CAPSULE | Freq: Every day | ORAL | Status: DC
  Administered 2024-09-10 – 2024-09-12 (×6): 50 mg via ORAL
  Filled 2024-09-10 (×5): qty 1

## 2024-09-10 MED ORDER — MORPHINE SULFATE (PF) 4 MG/ML IV SOLN
4.0000 mg | INTRAVENOUS | Status: DC | PRN
  Administered 2024-09-10 – 2024-09-13 (×28): 4 mg via INTRAVENOUS
  Filled 2024-09-10 (×17): qty 1

## 2024-09-10 MED ORDER — ONDANSETRON 4 MG PO TBDP: 4.0000 mg | ORAL_TABLET | Freq: Four times a day (QID) | ORAL | Status: DC | PRN

## 2024-09-10 MED ORDER — OXYCODONE HCL 5 MG PO TABS
10.0000 mg | ORAL_TABLET | ORAL | Status: DC | PRN
  Administered 2024-09-10 – 2024-09-13 (×21): 10 mg via ORAL
  Filled 2024-09-10 (×12): qty 2

## 2024-09-10 MED ORDER — OXYCODONE HCL 5 MG PO TABS
5.0000 mg | ORAL_TABLET | ORAL | Status: DC | PRN
  Administered 2024-09-10 – 2024-09-13 (×5): 5 mg via ORAL
  Filled 2024-09-10 (×3): qty 1

## 2024-09-10 MED ORDER — DOCUSATE SODIUM 100 MG PO CAPS: 100.0000 mg | ORAL_CAPSULE | Freq: Two times a day (BID) | ORAL | Status: DC

## 2024-09-10 MED ORDER — ONDANSETRON HCL 4 MG PO TABS
4.0000 mg | ORAL_TABLET | Freq: Four times a day (QID) | ORAL | Status: DC | PRN
  Administered 2024-09-12 (×2): 4 mg via ORAL
  Filled 2024-09-10: qty 1

## 2024-09-10 MED ORDER — POLYETHYLENE GLYCOL 3350 17 G PO PACK: 17.0000 g | PACK | Freq: Every day | ORAL | Status: DC | PRN

## 2024-09-10 MED ORDER — ACETAMINOPHEN 500 MG PO TABS
1000.0000 mg | ORAL_TABLET | Freq: Four times a day (QID) | ORAL | Status: DC
  Administered 2024-09-10 – 2024-09-13 (×24): 1000 mg via ORAL
  Filled 2024-09-10 (×13): qty 2

## 2024-09-10 MED ORDER — SODIUM CHLORIDE 0.9 % IV SOLN
2.0000 g | INTRAVENOUS | Status: AC
  Administered 2024-09-10 – 2024-09-12 (×6): 2 g via INTRAVENOUS
  Filled 2024-09-10 (×3): qty 20

## 2024-09-10 NOTE — Progress Notes (Signed)
   Subjective:  Gabrielle Wakeland is a 73 y.o. female, 1 Day Post-Op    s/p Procedure(s): IRRIGATION AND DEBRIDEMENT RIGHT ANKLE OPEN FRACTURE OPEN REDUCTION INTERNAL FIXATION, RIGHT ANKLE IRRIGATION AND DEBRIDEMENT LEFT LONG FINGER OPEN WOUND PERCUTANEOUS PINNING LEFT LONG FINGER CLOSED REDUCTION, FINGER, WITH PERCUTANEOUS PINNING RIGHT RING FINGER   Patient reports pain as moderate to severe.  Denies n/t in the leg.   Objective:   VITALS:   Vitals:   09/10/24 0305 09/10/24 0725 09/10/24 0800 09/10/24 1100  BP: (!) 114/53 128/65 121/89 (!) 144/71  Pulse: 71 71 75 77  Resp: 12 13 13 19   Temp: 97.8 F (36.6 C) 98.1 F (36.7 C) 98.1 F (36.7 C) 98 F (36.7 C)  TempSrc: Axillary Oral Oral Oral  SpO2: 99%  98% 100%  Weight:      Height:       In hospital chair NAD   RLE:  Splint and dressings in place, C/D/I. Able to wiggle toes, sensation intact. Cap refill less than 2 seconds  LLE: calf soft, nontender, intact dorsi/plantar flexion, able to wiggle toes n/v intact. KI in place.   Lab Results  Component Value Date   WBC 6.2 09/10/2024   HGB 8.5 (L) 09/10/2024   HCT 26.2 (L) 09/10/2024   MCV 85.1 09/10/2024   PLT 111 (L) 09/10/2024   BMET    Component Value Date/Time   NA 141 09/10/2024 0518   K 3.7 09/10/2024 0518   CL 109 09/10/2024 0518   CO2 23 09/10/2024 0518   GLUCOSE 149 (H) 09/10/2024 0518   BUN 6 (L) 09/10/2024 0518   CREATININE 0.59 09/10/2024 0518   CALCIUM 7.1 (L) 09/10/2024 0518   GFRNONAA >60 09/10/2024 0518     @[email or web address]   Assessment/Plan: 1 Day Post-Op   Principal Problem:   Open ankle fracture   Up with therapy  Right knee traumatic wound : Will plan to take down dressing for wound check in next week but dressing changes as needed if saturated with adaptic or xeroform dressing, 4x4 abd. .    Right ankle s/p ORIF: NWB to RLE in splint.   S/p Left prosthetic hip dislocation, reduced in ED, left supra-acetabular/ illiac  fracture. Discussed with Ortho Trauma recommended nonoperative treatment at this point and can be WBAT to the left lower extremity in the knee immobilizer. We discussed ensuring if too much pain to reduce WB.  May consider additional imaging. Knee immobilizer should be used x2 weeks until follow up and maintained posterior hip precautions  x 6 weeks.     Hand injuries per Dr. Murrell Dispo per primary.    Weightbearing Status: NWB RLE, WBAT LLE.  DVT Prophylaxis: Lovenox per primary team.   Will plan for follow up in 2 weeks for wound check, xrays.    Roye Gustafson D Kloe Oates 09/10/2024, 2:00 PM  Dayle Moores PA-C  Physician Assistant with Dr. Sharl [place]
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
## 2024-09-10 NOTE — Anesthesia Postprocedure Evaluation (Signed)
 Anesthesia Post Note  Patient: Rhonda Morales  Procedure(s) Performed: IRRIGATION AND DEBRIDEMENT RIGHT ANKLE OPEN FRACTURE (Right: Ankle) OPEN REDUCTION INTERNAL FIXATION, RIGHT ANKLE (Right: Ankle) IRRIGATION AND DEBRIDEMENT LEFT LONG FINGER OPEN WOUND (Left: Finger) PERCUTANEOUS PINNING LEFT LONG FINGER (Left: Finger) CLOSED REDUCTION, FINGER, WITH PERCUTANEOUS PINNING RIGHT RING FINGER (Finger)     Patient location during evaluation: PACU Anesthesia Type: General Level of consciousness: awake and alert Pain management: pain level controlled Vital Signs Assessment: post-procedure vital signs reviewed and stable Respiratory status: spontaneous breathing, nonlabored ventilation, respiratory function stable and patient connected to nasal cannula oxygen Cardiovascular status: blood pressure returned to baseline and stable Postop Assessment: no apparent nausea or vomiting Anesthetic complications: no   There were no known notable events for this encounter.              Ladajah Soltys D Portia Wisdom

## 2024-09-10 NOTE — Progress Notes (Signed)
  Inpatient Rehab Admissions Coordinator :  Per therapy recommendations, patient was screened for CIR candidacy by Ottie Glazier RN MSN.  At this time patient appears to be a potential candidate for CIR. I will place a rehab consult per protocol for full assessment. Please call me with any questions.  Ottie Glazier RN MSN Admissions Coordinator [phone]
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
## 2024-09-11 LAB — BASIC METABOLIC PANEL WITH GFR
Anion gap: 13 (ref 5–15)
BUN: 10 mg/dL (ref 8–23)
CO2: 22 mmol/L (ref 22–32)
Calcium: 8 mg/dL — ABNORMAL LOW (ref 8.9–10.3)
Chloride: 108 mmol/L (ref 98–111)
Creatinine, Ser: 0.77 mg/dL (ref 0.44–1.00)
GFR, Estimated: >60 mL/min (ref >60)
Glucose, Bld: 132 mg/dL — ABNORMAL HIGH (ref 70–99)
Potassium: 3.6 mmol/L (ref 3.5–5.1)
Sodium: 143 mmol/L (ref 135–145)

## 2024-09-11 LAB — CBC
HCT: 23.9 % — ABNORMAL LOW (ref 36.0–46.0)
Hemoglobin: 7.5 g/dL — ABNORMAL LOW (ref 12.0–15.0)
MCH: 27.5 pg (ref 26.0–34.0)
MCHC: 31.4 g/dL (ref 30.0–36.0)
MCV: 87.5 fL (ref 80.0–100.0)
Platelets: 155 K/uL (ref 150–400)
RBC: 2.73 MIL/uL — ABNORMAL LOW (ref 3.87–5.11)
RDW: 14.3 % (ref 11.5–15.5)
WBC: 8.9 K/uL (ref 4.0–10.5)
nRBC: 0 % (ref 0.0–0.2)
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
## 2024-09-11 NOTE — Progress Notes (Signed)
 Subjective: 2 Days Post-Op Procedure(s) (LRB): IRRIGATION AND DEBRIDEMENT RIGHT ANKLE OPEN FRACTURE (Right) OPEN REDUCTION INTERNAL FIXATION, RIGHT ANKLE (Right) IRRIGATION AND DEBRIDEMENT LEFT LONG FINGER OPEN WOUND (Left) PERCUTANEOUS PINNING LEFT LONG FINGER (Left) CLOSED REDUCTION, FINGER, WITH PERCUTANEOUS PINNING RIGHT RING FINGER Patient reports pain as moderate.  Controlled with oral meds.  She has been OOB with PT to transfer.  She c/o continued soreness in the right ankle, left knee and left hand.  Denies any significant pain in the pelvis.  Objective: Vital signs in last 24 hours: Temp:  [97.9 F (36.6 C)-98 F (36.7 C)] 97.9 F (36.6 C) (10/11 0000) Pulse Rate:  [69-78] 74 (10/11 0000) Resp:  [12-23] 18 (10/11 0300) BP: (109-156)/(47-71) 109/47 (10/11 0000) SpO2:  [95 %-100 %] 95 % (10/11 0000)  Intake/Output from previous day: 10/10 0701 - 10/11 0700 In: 1101 [I.V.:1001; IV Piggyback:100] Out: 503 [Urine:503] Intake/Output this shift: No intake/output data recorded.  Recent Labs    09/09/24 1409 09/09/24 1421 09/10/24 0518 09/11/24 0713  HGB 12.4 12.6 8.5* 7.5*   Recent Labs    09/10/24 0518 09/11/24 0713  WBC 6.2 8.9  RBC 3.08* 2.73*  HCT 26.2* 23.9*  PLT 111* 155   Recent Labs    09/10/24 0518 09/11/24 0713  NA 141 143  K 3.7 3.6  CL 109 108  CO2 23 22  BUN 6* 10  CREATININE 0.59 0.77  GLUCOSE 149* 132*  CALCIUM  7.1* 8.0*   Recent Labs    09/09/24 1409  INR 0.9    PE:  wnwd woman in nad.  A andO.  EOMI.  Resp unlabored.  L knee dressed and dry.  R ankle splinted.  NVI at the right forefoot.  L wrist splinted.   Assessment/Plan: 2 Days Post-Op Procedure(s) (LRB): IRRIGATION AND DEBRIDEMENT RIGHT ANKLE OPEN FRACTURE (Right) OPEN REDUCTION INTERNAL FIXATION, RIGHT ANKLE (Right) IRRIGATION AND DEBRIDEMENT LEFT LONG FINGER OPEN WOUND (Left) PERCUTANEOUS PINNING LEFT LONG FINGER (Left) CLOSED REDUCTION, FINGER, WITH PERCUTANEOUS PINNING  RIGHT RING FINGER Up with therapy  continue nwb R LE and L UE.  WBAT on L LE.      Rhonda Morales 09/11/2024, 8:49 AM
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
## 2024-09-11 NOTE — Progress Notes (Signed)
 Inpatient Rehab Admissions Coordinator:    I met with Pt. To discuss potential CIR admit. She is interested and states that her sons and husband can provide 24/7 assist at d/c. I will follow for potential admit pending medical readiness and bed availability.   Leita Kleine, MS, CCC-SLP Rehab Admissions Coordinator  [phone] (celll) [phone] (office)

## 2024-09-12 ENCOUNTER — Inpatient Hospital Stay (HOSPITAL_COMMUNITY)

## 2024-09-12 LAB — CBC
HCT: 25.9 % — ABNORMAL LOW (ref 36.0–46.0)
Hemoglobin: 8 g/dL — ABNORMAL LOW (ref 12.0–15.0)
MCH: 28 pg (ref 26.0–34.0)
MCHC: 30.9 g/dL (ref 30.0–36.0)
MCV: 90.6 fL (ref 80.0–100.0)
Platelets: 114 K/uL — ABNORMAL LOW (ref 150–400)
RBC: 2.86 MIL/uL — ABNORMAL LOW (ref 3.87–5.11)
RDW: 14.3 % (ref 11.5–15.5)
WBC: 6.7 K/uL (ref 4.0–10.5)
nRBC: 0 % (ref 0.0–0.2)

## 2024-09-12 LAB — BASIC METABOLIC PANEL WITH GFR
Anion gap: 11 (ref 5–15)
BUN: 8 mg/dL (ref 8–23)
CO2: 22 mmol/L (ref 22–32)
Calcium: 8.1 mg/dL — ABNORMAL LOW (ref 8.9–10.3)
Chloride: 109 mmol/L (ref 98–111)
Creatinine, Ser: 0.72 mg/dL (ref 0.44–1.00)
GFR, Estimated: >60 mL/min (ref >60)
Glucose, Bld: 105 mg/dL — ABNORMAL HIGH (ref 70–99)
Potassium: 3.5 mmol/L (ref 3.5–5.1)
Sodium: 142 mmol/L (ref 135–145)

## 2024-09-12 MED ORDER — QUETIAPINE FUMARATE 50 MG PO TABS
25.0000 mg | ORAL_TABLET | Freq: Every day | ORAL | Status: DC
  Administered 2024-09-12 (×2): 25 mg via ORAL
  Filled 2024-09-12: qty 1
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
## 2024-09-12 NOTE — Progress Notes (Signed)
 Subjective: 3 Days Post-Op Procedure(s) (LRB): IRRIGATION AND DEBRIDEMENT RIGHT ANKLE OPEN FRACTURE (Right) OPEN REDUCTION INTERNAL FIXATION, RIGHT ANKLE (Right) IRRIGATION AND DEBRIDEMENT LEFT LONG FINGER OPEN WOUND (Left) PERCUTANEOUS PINNING LEFT LONG FINGER (Left) CLOSED REDUCTION, FINGER, WITH PERCUTANEOUS PINNING RIGHT RING FINGER Patient reports pain as moderate.   C/o ankle pain this AM. Ankle/ foot is not elevated. Denies numbness or tingling.  Objective: Vital signs in last 24 hours: Temp:  [97.9 F (36.6 C)-98.7 F (37.1 C)] 98.4 F (36.9 C) (10/12 0319) Pulse Rate:  [67-73] 67 (10/12 0319) Resp:  [11-19] 12 (10/12 0319) BP: (104-191)/(63-78) 151/72 (10/12 0319) SpO2:  [95 %-99 %] 99 % (10/12 0319)  Intake/Output from previous day: 10/11 0701 - 10/12 0700 In: 240 [P.O.:240] Out: -  Intake/Output this shift: No intake/output data recorded.  Recent Labs    09/09/24 1409 09/09/24 1421 09/10/24 0518 09/11/24 0713 09/12/24 0642  HGB 12.4 12.6 8.5* 7.5* 8.0*   Recent Labs    09/11/24 0713 09/12/24 0642  WBC 8.9 6.7  RBC 2.73* 2.86*  HCT 23.9* 25.9*  PLT 155 114*   Recent Labs    09/10/24 0518 09/11/24 0713  NA 141 143  K 3.7 3.6  CL 109 108  CO2 23 22  BUN 6* 10  CREATININE 0.59 0.77  GLUCOSE 149* 132*  CALCIUM  7.1* 8.0*   Recent Labs    09/09/24 1409  INR 0.9    Neurologically intact ABD soft Neurovascular intact Sensation intact distally Intact pulses distally Dorsiflexion/Plantar flexion intact Incision: dressing C/D/I and no drainage No cellulitis present Compartment soft No sign of DVT Able to wiggle toes Sensation intact   Assessment/Plan: 3 Days Post-Op Procedure(s) (LRB): IRRIGATION AND DEBRIDEMENT RIGHT ANKLE OPEN FRACTURE (Right) OPEN REDUCTION INTERNAL FIXATION, RIGHT ANKLE (Right) IRRIGATION AND DEBRIDEMENT LEFT LONG FINGER OPEN WOUND (Left) PERCUTANEOUS PINNING LEFT LONG FINGER (Left) CLOSED REDUCTION, FINGER, WITH  PERCUTANEOUS PINNING RIGHT RING FINGER Advance diet Up with therapy D/C IV fluids Aggressive ice and elevation, toes above the nose, to help with swelling and therefore with pain Continue NWB RLE and LUE. May WBAT LLE   Rhonda Morales 09/12/2024, 7:25 AM

## 2024-09-12 NOTE — Plan of Care (Signed)

## 2024-09-13 ENCOUNTER — Inpatient Hospital Stay (HOSPITAL_COMMUNITY)
Admission: AD | Admit: 2024-09-13 | Discharge: 2024-09-21 | DRG: 560 | Disposition: A | Discharge status: Home / Self Care | Source: Intra-hospital | Attending: Physical Medicine and Rehabilitation | Admitting: Physical Medicine and Rehabilitation

## 2024-09-13 ENCOUNTER — Encounter: Payer: Self-pay | Admitting: Student

## 2024-09-13 ENCOUNTER — Other Ambulatory Visit: Payer: Self-pay

## 2024-09-13 ENCOUNTER — Encounter (HOSPITAL_COMMUNITY): Payer: Self-pay | Admitting: Internal Medicine

## 2024-09-13 ENCOUNTER — Encounter (HOSPITAL_COMMUNITY): Payer: Self-pay | Admitting: General Surgery

## 2024-09-13 DIAGNOSIS — F419 Anxiety disorder, unspecified: Secondary | ICD-10-CM | POA: Diagnosis present

## 2024-09-13 DIAGNOSIS — Z79891 Long term (current) use of opiate analgesic: Secondary | ICD-10-CM

## 2024-09-13 DIAGNOSIS — R52 Pain, unspecified: Secondary | ICD-10-CM | POA: Diagnosis not present

## 2024-09-13 DIAGNOSIS — E785 Hyperlipidemia, unspecified: Secondary | ICD-10-CM | POA: Diagnosis present

## 2024-09-13 DIAGNOSIS — K5901 Slow transit constipation: Secondary | ICD-10-CM | POA: Diagnosis not present

## 2024-09-13 DIAGNOSIS — S62613D Displaced fracture of proximal phalanx of left middle finger, subsequent encounter for fracture with routine healing: Secondary | ICD-10-CM | POA: Diagnosis not present

## 2024-09-13 DIAGNOSIS — S62315D Displaced fracture of base of fourth metacarpal bone, left hand, subsequent encounter for fracture with routine healing: Secondary | ICD-10-CM | POA: Diagnosis not present

## 2024-09-13 DIAGNOSIS — I10 Essential (primary) hypertension: Secondary | ICD-10-CM | POA: Insufficient documentation

## 2024-09-13 DIAGNOSIS — R110 Nausea: Secondary | ICD-10-CM | POA: Diagnosis present

## 2024-09-13 DIAGNOSIS — K219 Gastro-esophageal reflux disease without esophagitis: Secondary | ICD-10-CM | POA: Diagnosis present

## 2024-09-13 DIAGNOSIS — Y92410 Unspecified street and highway as the place of occurrence of the external cause: Secondary | ICD-10-CM | POA: Diagnosis not present

## 2024-09-13 DIAGNOSIS — S82899B Other fracture of unspecified lower leg, initial encounter for open fracture type I or II: Secondary | ICD-10-CM | POA: Diagnosis present

## 2024-09-13 DIAGNOSIS — G8929 Other chronic pain: Secondary | ICD-10-CM | POA: Diagnosis present

## 2024-09-13 DIAGNOSIS — S82841E Displaced bimalleolar fracture of right lower leg, subsequent encounter for open fracture type I or II with routine healing: Secondary | ICD-10-CM | POA: Diagnosis present

## 2024-09-13 DIAGNOSIS — D649 Anemia, unspecified: Secondary | ICD-10-CM | POA: Diagnosis present

## 2024-09-13 DIAGNOSIS — F320 Major depressive disorder, single episode, mild: Secondary | ICD-10-CM | POA: Diagnosis present

## 2024-09-13 DIAGNOSIS — S73005D Unspecified dislocation of left hip, subsequent encounter: Secondary | ICD-10-CM

## 2024-09-13 DIAGNOSIS — H903 Sensorineural hearing loss, bilateral: Secondary | ICD-10-CM | POA: Diagnosis present

## 2024-09-13 DIAGNOSIS — N3010 Interstitial cystitis (chronic) without hematuria: Secondary | ICD-10-CM | POA: Diagnosis present

## 2024-09-13 DIAGNOSIS — K5900 Constipation, unspecified: Secondary | ICD-10-CM | POA: Diagnosis present

## 2024-09-13 DIAGNOSIS — Z23 Encounter for immunization: Secondary | ICD-10-CM | POA: Diagnosis not present

## 2024-09-13 DIAGNOSIS — M25462 Effusion, left knee: Secondary | ICD-10-CM | POA: Diagnosis present

## 2024-09-13 DIAGNOSIS — Z79899 Other long term (current) drug therapy: Secondary | ICD-10-CM | POA: Diagnosis not present

## 2024-09-13 DIAGNOSIS — M25562 Pain in left knee: Secondary | ICD-10-CM | POA: Diagnosis present

## 2024-09-13 DIAGNOSIS — G8918 Other acute postprocedural pain: Secondary | ICD-10-CM | POA: Diagnosis not present

## 2024-09-13 DIAGNOSIS — S82842F Displaced bimalleolar fracture of left lower leg, subsequent encounter for open fracture type IIIA, IIIB, or IIIC with routine healing: Secondary | ICD-10-CM | POA: Diagnosis not present

## 2024-09-13 DIAGNOSIS — T07XXXA Unspecified multiple injuries, initial encounter: Principal | ICD-10-CM | POA: Diagnosis present

## 2024-09-13 DIAGNOSIS — Z886 Allergy status to analgesic agent status: Secondary | ICD-10-CM

## 2024-09-13 DIAGNOSIS — H9193 Unspecified hearing loss, bilateral: Secondary | ICD-10-CM | POA: Diagnosis present

## 2024-09-13 DIAGNOSIS — M545 Low back pain, unspecified: Secondary | ICD-10-CM | POA: Diagnosis present

## 2024-09-13 DIAGNOSIS — M7989 Other specified soft tissue disorders: Secondary | ICD-10-CM | POA: Diagnosis not present

## 2024-09-13 LAB — CBC
HCT: 26.9 % — ABNORMAL LOW (ref 36.0–46.0)
Hemoglobin: 8.5 g/dL — ABNORMAL LOW (ref 12.0–15.0)
MCH: 27.8 pg (ref 26.0–34.0)
MCHC: 31.6 g/dL (ref 30.0–36.0)
MCV: 87.9 fL (ref 80.0–100.0)
Platelets: 137 K/uL — ABNORMAL LOW (ref 150–400)
RBC: 3.06 MIL/uL — ABNORMAL LOW (ref 3.87–5.11)
RDW: 14.1 % (ref 11.5–15.5)
WBC: 6.8 K/uL (ref 4.0–10.5)
nRBC: 0.3 % — ABNORMAL HIGH (ref 0.0–0.2)

## 2024-09-13 LAB — BASIC METABOLIC PANEL WITH GFR
Anion gap: 12 (ref 5–15)
BUN: 9 mg/dL (ref 8–23)
CO2: 21 mmol/L — ABNORMAL LOW (ref 22–32)
Calcium: 8.2 mg/dL — ABNORMAL LOW (ref 8.9–10.3)
Chloride: 108 mmol/L (ref 98–111)
Creatinine, Ser: 0.71 mg/dL (ref 0.44–1.00)
GFR, Estimated: >60 mL/min (ref >60)
Glucose, Bld: 102 mg/dL — ABNORMAL HIGH (ref 70–99)
Potassium: 3.5 mmol/L (ref 3.5–5.1)
Sodium: 141 mmol/L (ref 135–145)

## 2024-09-13 MED ORDER — ALPRAZOLAM 0.25 MG PO TABS
0.2500 mg | ORAL_TABLET | Freq: Two times a day (BID) | ORAL | Status: DC | PRN
  Administered 2024-09-15 – 2024-09-20 (×8): 0.25 mg via ORAL
  Filled 2024-09-13 (×8): qty 1

## 2024-09-13 MED ORDER — ENSURE PLUS HIGH PROTEIN PO LIQD: 237.0000 mL | Freq: Three times a day (TID) | ORAL | Status: DC

## 2024-09-13 MED ORDER — CYCLOBENZAPRINE HCL 10 MG PO TABS
10.0000 mg | ORAL_TABLET | Freq: Three times a day (TID) | ORAL | Status: DC | PRN
  Administered 2024-09-18 – 2024-09-21 (×3): 10 mg via ORAL
  Filled 2024-09-13 (×4): qty 1

## 2024-09-13 MED ORDER — ONDANSETRON HCL 4 MG PO TABS: 4.0000 mg | ORAL_TABLET | Freq: Four times a day (QID) | ORAL | Status: DC | PRN

## 2024-09-13 MED ORDER — IBUPROFEN 600 MG PO TABS
600.0000 mg | ORAL_TABLET | Freq: Four times a day (QID) | ORAL | Status: DC
  Administered 2024-09-13 – 2024-09-14 (×4): 600 mg via ORAL
  Filled 2024-09-13 (×15): qty 1

## 2024-09-13 MED ORDER — POLYETHYLENE GLYCOL 3350 17 G PO PACK: 17.0000 g | PACK | Freq: Every day | ORAL | Status: DC | PRN

## 2024-09-13 MED ORDER — INFLUENZA VAC SPLIT HIGH-DOSE 0.5 ML IM SUSY
0.5000 mL | PREFILLED_SYRINGE | INTRAMUSCULAR | Status: AC
  Administered 2024-09-15: 0.5 mL via INTRAMUSCULAR
  Filled 2024-09-13: qty 0.5

## 2024-09-13 MED ORDER — NITROFURANTOIN MACROCRYSTAL 50 MG PO CAPS
50.0000 mg | ORAL_CAPSULE | Freq: Every day | ORAL | Status: DC
  Administered 2024-09-13 – 2024-09-20 (×8): 50 mg via ORAL
  Filled 2024-09-13 (×9): qty 1

## 2024-09-13 MED ORDER — IBUPROFEN 200 MG PO TABS
600.0000 mg | ORAL_TABLET | Freq: Four times a day (QID) | ORAL | Status: DC
  Filled 2024-09-13: qty 3

## 2024-09-13 MED ORDER — QUETIAPINE 12.5 MG HALF TABLET
12.0000 mg | ORAL_TABLET | Freq: Every day | ORAL | Status: DC
Start: 2024-09-13 — End: 2024-09-14
  Administered 2024-09-13: 12.5 mg via ORAL
  Filled 2024-09-13: qty 1

## 2024-09-13 MED ORDER — QUETIAPINE FUMARATE 25 MG PO TABS: 25.0000 mg | ORAL_TABLET | Freq: Every day | ORAL | Status: DC

## 2024-09-13 MED ORDER — ENOXAPARIN SODIUM 30 MG/0.3ML IJ SOSY
30.0000 mg | PREFILLED_SYRINGE | Freq: Two times a day (BID) | INTRAMUSCULAR | Status: DC
  Administered 2024-09-13 – 2024-09-21 (×16): 30 mg via SUBCUTANEOUS
  Filled 2024-09-13 (×17): qty 0.3

## 2024-09-13 MED ORDER — DOCUSATE SODIUM 100 MG PO CAPS
100.0000 mg | ORAL_CAPSULE | Freq: Two times a day (BID) | ORAL | Status: DC
  Administered 2024-09-13 – 2024-09-21 (×16): 100 mg via ORAL
  Filled 2024-09-13 (×17): qty 1

## 2024-09-13 MED ORDER — OXYCODONE HCL 5 MG PO TABS
10.0000 mg | ORAL_TABLET | ORAL | Status: DC | PRN
  Administered 2024-09-13: 10 mg via ORAL
  Filled 2024-09-13 (×2): qty 2

## 2024-09-13 MED ORDER — OXYCODONE HCL 5 MG PO TABS
10.0000 mg | ORAL_TABLET | ORAL | Status: DC | PRN
  Administered 2024-09-15 – 2024-09-21 (×6): 10 mg via ORAL
  Filled 2024-09-13 (×6): qty 2

## 2024-09-13 MED ORDER — GABAPENTIN 300 MG PO CAPS
600.0000 mg | ORAL_CAPSULE | Freq: Three times a day (TID) | ORAL | Status: DC
  Administered 2024-09-13 – 2024-09-21 (×23): 600 mg via ORAL
  Filled 2024-09-13 (×17): qty 2
  Filled 2024-09-13: qty 6
  Filled 2024-09-13 (×8): qty 2

## 2024-09-13 MED ORDER — GABAPENTIN 300 MG PO CAPS
600.0000 mg | ORAL_CAPSULE | Freq: Three times a day (TID) | ORAL | Status: DC
  Administered 2024-09-13: 600 mg via ORAL
  Filled 2024-09-13: qty 2

## 2024-09-13 MED ORDER — ONDANSETRON HCL 4 MG/2ML IJ SOLN: 4.0000 mg | Freq: Four times a day (QID) | INTRAMUSCULAR | Status: DC | PRN

## 2024-09-13 MED ORDER — MORPHINE SULFATE (PF) 2 MG/ML IV SOLN: 2.0000 mg | Freq: Three times a day (TID) | INTRAVENOUS | Status: DC | PRN

## 2024-09-13 MED ORDER — OXYCODONE HCL 5 MG PO TABS
15.0000 mg | ORAL_TABLET | ORAL | Status: DC | PRN
  Filled 2024-09-13: qty 3

## 2024-09-13 MED ORDER — OXYCODONE HCL 5 MG PO TABS
15.0000 mg | ORAL_TABLET | ORAL | Status: DC | PRN
  Administered 2024-09-13 – 2024-09-20 (×25): 15 mg via ORAL
  Filled 2024-09-13 (×26): qty 3

## 2024-09-13 MED ORDER — TRAMADOL HCL 50 MG PO TABS
100.0000 mg | ORAL_TABLET | Freq: Four times a day (QID) | ORAL | Status: DC
Start: 2024-09-13 — End: 2024-09-15
  Administered 2024-09-13 – 2024-09-15 (×7): 100 mg via ORAL
  Filled 2024-09-13 (×7): qty 2

## 2024-09-13 MED ORDER — ENOXAPARIN SODIUM 30 MG/0.3ML IJ SOSY: 30.0000 mg | PREFILLED_SYRINGE | Freq: Two times a day (BID) | INTRAMUSCULAR | Status: DC

## 2024-09-13 MED ORDER — METHOCARBAMOL 500 MG PO TABS
1000.0000 mg | ORAL_TABLET | Freq: Three times a day (TID) | ORAL | Status: DC
  Administered 2024-09-13 – 2024-09-21 (×23): 1000 mg via ORAL
  Filled 2024-09-13 (×24): qty 2

## 2024-09-13 MED ORDER — PANTOPRAZOLE SODIUM 40 MG PO TBEC
40.0000 mg | DELAYED_RELEASE_TABLET | Freq: Every morning | ORAL | Status: DC
  Administered 2024-09-14 – 2024-09-21 (×8): 40 mg via ORAL
  Filled 2024-09-13 (×5): qty 1
  Filled 2024-09-13: qty 2
  Filled 2024-09-13 (×2): qty 1

## 2024-09-13 MED ORDER — ENSURE PLUS HIGH PROTEIN PO LIQD
237.0000 mL | Freq: Three times a day (TID) | ORAL | Status: DC
  Administered 2024-09-14 – 2024-09-21 (×21): 237 mL via ORAL

## 2024-09-13 MED ORDER — ACETAMINOPHEN 325 MG PO TABS
325.0000 mg | ORAL_TABLET | ORAL | Status: DC | PRN
  Administered 2024-09-17 – 2024-09-21 (×3): 650 mg via ORAL
  Filled 2024-09-13 (×3): qty 2

## 2024-09-13 MED ORDER — METHOCARBAMOL 500 MG PO TABS
1000.0000 mg | ORAL_TABLET | Freq: Three times a day (TID) | ORAL | Status: DC
  Administered 2024-09-13: 1000 mg via ORAL
  Filled 2024-09-13: qty 2

## 2024-09-13 MED ORDER — TRAMADOL HCL 50 MG PO TABS
100.0000 mg | ORAL_TABLET | Freq: Four times a day (QID) | ORAL | Status: DC
  Administered 2024-09-13: 100 mg via ORAL
  Filled 2024-09-13: qty 2

## 2024-09-13 MED ORDER — ATENOLOL 50 MG PO TABS
50.0000 mg | ORAL_TABLET | Freq: Every day | ORAL | Status: DC
  Administered 2024-09-14 – 2024-09-21 (×8): 50 mg via ORAL
  Filled 2024-09-13 (×8): qty 1

## 2024-09-13 NOTE — Plan of Care (Signed)
 Problem: Education: Goal: Knowledge of General Education information will improve Description: Including pain rating scale, medication(s)/side effects and non-pharmacologic comfort measures 09/13/2024 0338 by Margrette Reine POUR, RN Outcome: Progressing 09/13/2024 0328 by Margrette Reine POUR, RN Outcome: Progressing   Problem: Health Behavior/Discharge Planning: Goal: Ability to manage health-related needs will improve 09/13/2024 0338 by Margrette Reine POUR, RN Outcome: Progressing 09/13/2024 0328 by Margrette Reine POUR, RN Outcome: Progressing   Problem: Clinical Measurements: Goal: Ability to maintain clinical measurements within normal limits will improve 09/13/2024 0338 by Margrette Reine POUR, RN Outcome: Progressing 09/13/2024 0328 by Margrette Reine POUR, RN Outcome: Progressing Goal: Will remain free from infection 09/13/2024 0338 by Margrette Reine POUR, RN Outcome: Progressing 09/13/2024 0328 by Margrette Reine POUR, RN Outcome: Progressing Goal: Diagnostic test results will improve 09/13/2024 0338 by Margrette Reine POUR, RN Outcome: Progressing 09/13/2024 0328 by Margrette Reine POUR, RN Outcome: Progressing Goal: Respiratory complications will improve 09/13/2024 0338 by Margrette Reine POUR, RN Outcome: Progressing 09/13/2024 0328 by Margrette Reine POUR, RN Outcome: Progressing Goal: Cardiovascular complication will be avoided 09/13/2024 0338 by Samella Lucchetti K, RN Outcome: Progressing 09/13/2024 0328 by Cheryle Dark K, RN Outcome: Progressing   Problem: Activity: Goal: Risk for activity intolerance will decrease 09/13/2024 0338 by Margrette Reine POUR, RN Outcome: Progressing 09/13/2024 0328 by Margrette Reine POUR, RN Outcome: Progressing   Problem: Nutrition: Goal: Adequate nutrition will be maintained 09/13/2024 0338 by Margrette Reine POUR, RN Outcome: Progressing 09/13/2024 0328 by Margrette Reine POUR, RN Outcome: Progressing   Problem: Coping: Goal: Level of anxiety will  decrease 09/13/2024 0338 by Margrette Reine POUR, RN Outcome: Progressing 09/13/2024 0328 by Margrette Reine POUR, RN Outcome: Progressing   Problem: Elimination: Goal: Will not experience complications related to bowel motility 09/13/2024 0338 by Margrette Reine POUR, RN Outcome: Progressing 09/13/2024 0328 by Margrette Reine POUR, RN Outcome: Progressing Goal: Will not experience complications related to urinary retention 09/13/2024 0338 by Margrette Reine POUR, RN Outcome: Progressing 09/13/2024 0328 by Margrette Reine POUR, RN Outcome: Progressing   Problem: Pain Managment: Goal: General experience of comfort will improve and/or be controlled 09/13/2024 0338 by Margrette Reine POUR, RN Outcome: Progressing 09/13/2024 0328 by Margrette Reine POUR, RN Outcome: Progressing   Problem: Safety: Goal: Ability to remain free from injury will improve 09/13/2024 0338 by Margrette Reine POUR, RN Outcome: Progressing 09/13/2024 0328 by Margrette Reine POUR, RN Outcome: Progressing   Problem: Skin Integrity: Goal: Risk for impaired skin integrity will decrease 09/13/2024 0338 by Margrette Reine POUR, RN Outcome: Progressing 09/13/2024 0328 by Margrette Reine POUR, RN Outcome: Progressing   Problem: Education: Goal: Knowledge of General Education information will improve Description: Including pain rating scale, medication(s)/side effects and non-pharmacologic comfort measures 09/13/2024 0338 by Margrette Reine POUR, RN Outcome: Progressing 09/13/2024 0328 by Margrette Reine POUR, RN Outcome: Progressing   Problem: Health Behavior/Discharge Planning: Goal: Ability to manage health-related needs will improve 09/13/2024 0338 by Margrette Reine POUR, RN Outcome: Progressing 09/13/2024 0328 by Margrette Reine POUR, RN Outcome: Progressing   Problem: Clinical Measurements: Goal: Ability to maintain clinical measurements within normal limits will improve 09/13/2024 0338 by Margrette Reine POUR, RN Outcome:  Progressing 09/13/2024 0328 by Margrette Reine POUR, RN Outcome: Progressing Goal: Will remain free from infection 09/13/2024 0338 by Margrette Reine POUR, RN Outcome: Progressing 09/13/2024 0328 by Margrette Reine POUR, RN Outcome: Progressing Goal: Diagnostic test results will improve 09/13/2024 0338 by Margrette Reine POUR, RN Outcome: Progressing 09/13/2024 0328 by Koleton Duchemin K, RN Outcome: Progressing Goal: Respiratory complications will improve  09/13/2024 0338 by Margrette Reine POUR, RN Outcome: Progressing 09/13/2024 0328 by Margrette Reine POUR, RN Outcome: Progressing Goal: Cardiovascular complication will be avoided 09/13/2024 0338 by Sofya Moustafa K, RN Outcome: Progressing 09/13/2024 0328 by Margrette Reine POUR, RN Outcome: Progressing   Problem: Activity: Goal: Risk for activity intolerance will decrease 09/13/2024 0338 by Margrette Reine POUR, RN Outcome: Progressing 09/13/2024 0328 by Margrette Reine POUR, RN Outcome: Progressing   Problem: Nutrition: Goal: Adequate nutrition will be maintained 09/13/2024 0338 by Margrette Reine POUR, RN Outcome: Progressing 09/13/2024 0328 by Margrette Reine POUR, RN Outcome: Progressing   Problem: Coping: Goal: Level of anxiety will decrease 09/13/2024 0338 by Margrette Reine POUR, RN Outcome: Progressing 09/13/2024 0328 by Margrette Reine POUR, RN Outcome: Progressing   Problem: Elimination: Goal: Will not experience complications related to bowel motility 09/13/2024 0338 by Margrette Reine POUR, RN Outcome: Progressing 09/13/2024 0328 by Margrette Reine POUR, RN Outcome: Progressing Goal: Will not experience complications related to urinary retention 09/13/2024 0338 by Margrette Reine POUR, RN Outcome: Progressing 09/13/2024 0328 by Margrette Reine POUR, RN Outcome: Progressing   Problem: Pain Managment: Goal: General experience of comfort will improve and/or be controlled 09/13/2024 0338 by Margrette Reine POUR, RN Outcome: Progressing 09/13/2024  0328 by Margrette Reine POUR, RN Outcome: Progressing   Problem: Safety: Goal: Ability to remain free from injury will improve 09/13/2024 0338 by Margrette Reine POUR, RN Outcome: Progressing 09/13/2024 0328 by Margrette Reine POUR, RN Outcome: Progressing   Problem: Skin Integrity: Goal: Risk for impaired skin integrity will decrease 09/13/2024 0338 by Margrette Reine POUR, RN Outcome: Progressing 09/13/2024 0328 by Juliyah Mergen K, RN Outcome: Progressing

## 2024-09-13 NOTE — Progress Notes (Signed)
 Inpatient Rehab Admissions Coordinator:   I have a CIR bed for this Pt. Today, RN may call report to [phone].   Pt. To admit to CIR for estimated 18-21 days with goal of dc home with her family at min A level.  Leita Kleine, MS, CCC-SLP Rehab Admissions Coordinator  [phone] (celll) [phone] (office)

## 2024-09-13 NOTE — TOC Transition Note (Signed)
 Transition of Care [facility]) - Discharge Note   Patient Details  Name: Rhonda Morales MRN: [id number] Date of Birth: 07/12/1951  Transition of Care [facility]) CM/SW Contact:  Johntay Doolen E Schyler Butikofer, LCSW Phone Number: 09/13/2024, 12:41 PM   Clinical Narrative:    Patient is discharging to [facility].   Final next level of care: IP Rehab Facility Barriers to Discharge: Barriers Resolved   Patient Goals and CMS Choice            Discharge Placement                       Discharge Plan and Services Additional resources added to the After Visit Summary for     Discharge Planning Services: CM Consult                                 Social Drivers of Health (SDOH) Interventions SDOH Screenings   Food Insecurity: No Food Insecurity (09/13/2024)  Housing: Low Risk  (09/13/2024)  Transportation Needs: No Transportation Needs (09/13/2024)  Utilities: Not At Risk (09/13/2024)  Social Connections: Moderately Isolated (09/13/2024)     Readmission Risk Interventions     No data to display
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
## 2024-09-13 NOTE — Progress Notes (Signed)
 Inpatient Rehabilitation Admission Medication Review by a Pharmacist  A complete drug regimen review was completed for this patient to identify any potential clinically significant medication issues.  High Risk Drug Classes Is patient taking? Indication by Medication  Antipsychotic No   Anticoagulant Yes Lovenox  - DVT px  Antibiotic Yes Nitrofurantoin  - UTI px  Opioid Yes Oxycodone mardella - pain  Antiplatelet No   Hypoglycemics/insulin No   Vasoactive Medication Yes Atenolol  - HTN  Chemotherapy No   Other Yes Xanax  - anxiety Flexeril /robaxin - spasms Ensure - nutrition Neurontin  - neuropathy Ibuprofen - pain Protonix  - GERD     Type of Medication Issue Identified Description of Issue Recommendation(s)  Drug Interaction(s) (clinically significant)     Duplicate Therapy     Allergy     No Medication Administration End Date     Incorrect Dose     Additional Drug Therapy Needed     Significant med changes from prior encounter (inform family/care partners about these prior to discharge).    Other       Clinically significant medication issues were identified that warrant physician communication and completion of prescribed/recommended actions by midnight of the next day:  No  Name of provider notified for urgent issues identified:   Provider Method of Notification:     Pharmacist comments:   Time spent performing this drug regimen review (minutes):  20  Sergio Batch, PharmD, [place], AAHIVP, CPP Infectious Disease Pharmacist 09/13/2024 3:02 PM

## 2024-09-13 NOTE — Discharge Summary (Signed)
 Physician Discharge Summary  Patient ID: Rhonda Morales MRN: [id number] DOB/AGE: 71/17/1952 71 y.o.  Admit date: 09/09/2024 Discharge date: 09/13/2024  Discharge Diagnoses Patient Active Problem List   Diagnosis Date Noted   Open ankle fracture 09/09/2024    Consultants ***  Procedures ***  HPI: ***  Hospital Course: ***    Allergies as of 09/13/2024   No Known Allergies   Med Rec must be completed prior to using this SMARTLINK***          Signed: Burnard JONELLE Louder , [facility] 09/13/2024, 12:33 PM Please see Amion for pager number during day hours 7:00am-4:30pm
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
## 2024-09-13 NOTE — Care Management Important Message (Signed)
 Important Message  Patient Details  Name: Rhonda Morales MRN: [id number] Date of Birth: June 10, 1951   Important Message Given:  Yes - Medicare IM     Jon Cruel 09/13/2024, 4:42 PM

## 2024-09-14 ENCOUNTER — Inpatient Hospital Stay (HOSPITAL_COMMUNITY)

## 2024-09-14 DIAGNOSIS — M7989 Other specified soft tissue disorders: Secondary | ICD-10-CM | POA: Diagnosis not present

## 2024-09-14 DIAGNOSIS — T07XXXA Unspecified multiple injuries, initial encounter: Secondary | ICD-10-CM | POA: Diagnosis not present

## 2024-09-14 LAB — COMPREHENSIVE METABOLIC PANEL WITH GFR
ALT: 45 U/L — ABNORMAL HIGH (ref 0–44)
AST: 32 U/L (ref 15–41)
Albumin: 3.2 g/dL — ABNORMAL LOW (ref 3.5–5.0)
Alkaline Phosphatase: 113 U/L (ref 38–126)
Anion gap: 13 (ref 5–15)
BUN: 13 mg/dL (ref 8–23)
CO2: 24 mmol/L (ref 22–32)
Calcium: 8.8 mg/dL — ABNORMAL LOW (ref 8.9–10.3)
Chloride: 103 mmol/L (ref 98–111)
Creatinine, Ser: 0.75 mg/dL (ref 0.44–1.00)
GFR, Estimated: >60 mL/min (ref >60)
Glucose, Bld: 134 mg/dL — ABNORMAL HIGH (ref 70–99)
Potassium: 3.5 mmol/L (ref 3.5–5.1)
Sodium: 140 mmol/L (ref 135–145)
Total Bilirubin: 0.9 mg/dL (ref 0.0–1.2)
Total Protein: 6.8 g/dL (ref 6.5–8.1)

## 2024-09-14 LAB — CBC WITH DIFFERENTIAL/PLATELET
Abs Immature Granulocytes: 0.51 K/uL — ABNORMAL HIGH (ref 0.00–0.07)
Basophils Absolute: 0.1 K/uL (ref 0.0–0.1)
Basophils Relative: 1 %
Eosinophils Absolute: 0.2 K/uL (ref 0.0–0.5)
Eosinophils Relative: 2 %
HCT: 32.1 % — ABNORMAL LOW (ref 36.0–46.0)
Hemoglobin: 10.2 g/dL — ABNORMAL LOW (ref 12.0–15.0)
Immature Granulocytes: 5 %
Lymphocytes Relative: 16 %
Lymphs Abs: 1.8 K/uL (ref 0.7–4.0)
MCH: 27.8 pg (ref 26.0–34.0)
MCHC: 31.8 g/dL (ref 30.0–36.0)
MCV: 87.5 fL (ref 80.0–100.0)
Monocytes Absolute: 0.9 K/uL (ref 0.1–1.0)
Monocytes Relative: 8 %
Neutro Abs: 7.7 K/uL (ref 1.7–7.7)
Neutrophils Relative %: 68 %
Platelets: 232 K/uL (ref 150–400)
RBC: 3.67 MIL/uL — ABNORMAL LOW (ref 3.87–5.11)
RDW: 14.1 % (ref 11.5–15.5)
WBC: 11.2 K/uL — ABNORMAL HIGH (ref 4.0–10.5)
nRBC: 0.3 % — ABNORMAL HIGH (ref 0.0–0.2)

## 2024-09-14 MED ORDER — QUETIAPINE 12.5 MG HALF TABLET
12.0000 mg | ORAL_TABLET | Freq: Every evening | ORAL | Status: DC | PRN
  Filled 2024-09-14: qty 1

## 2024-09-14 MED ORDER — QUETIAPINE 12.5 MG HALF TABLET
12.5000 mg | ORAL_TABLET | Freq: Every evening | ORAL | Status: DC | PRN
  Administered 2024-09-14 – 2024-09-15 (×2): 12.5 mg via ORAL
  Filled 2024-09-14 (×4): qty 1
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
## 2024-09-14 NOTE — Progress Notes (Signed)
 Physical Therapy Session Note  Patient Details  Name: Rhonda Morales MRN: [id number] Date of Birth: 1951-11-16  Today's Date: 09/14/2024 PT Individual Time: 1000-1100 PT Individual Time Calculation (min): 60 min   Short Term Goals: Week 1:  PT Short Term Goal 1 (Week 1): Pt will transfer sup to sit w/ min A consistently from flat bed. PT Short Term Goal 2 (Week 1): Pt will transfer bed <> w/c w/ min A. PT Short Term Goal 3 (Week 1): PT to assess sit to stand/SPT w/ platform walker.  Skilled Therapeutic Interventions/Progress Updates:  pt received in bed and agreeable to therapy. Pt reports 6/10 pain in her L hip, requested pain medication from nsg at end of session. Bed mobility with min a for LLE management and increased time. Pt stood to stedy with max a d/t WB precautions and knee immobilizer. Pt is extremely limited by L hip pain preventing WB or active movement of this extremity. Pt transported to therapy gym for time management and energy conservation. Pt participated in RLE LAQ and LLE ankle DF/PF with yellow T band for LE strength and endurance. Pt returned to room and remained in w/c with needs in reach.   Therapy Documentation Precautions:  Precautions Precautions: Fall, Posterior Hip Precaution/Restrictions Comments: reviewed posterior hip precautions LLE, per ortho posterior hip precautions x6 weeks and KI x2 weeks Required Braces or Orthoses: Knee Immobilizer - Left Knee Immobilizer - Left: On at all times Restrictions Weight Bearing Restrictions Per Provider Order: Yes LUE Weight Bearing Per Provider Order: Weight bear through elbow only RLE Weight Bearing Per Provider Order: Non weight bearing LLE Weight Bearing Per Provider Order: Weight bearing as tolerated General: Chart Reviewed: Yes Family/Caregiver Present: No     Therapy/Group: Individual Therapy  Schuyler JAYSON Batter 09/14/2024, 3:55 PM

## 2024-09-14 NOTE — Progress Notes (Signed)
 BLE venous duplex has been completed.   Results can be found under chart review under CV PROC. 09/14/2024 6:07 PM Imraan Wendell RVT, RDMS

## 2024-09-14 NOTE — Progress Notes (Signed)
 Inpatient Rehabilitation  Patient information reviewed and entered into eRehab system by [facility]. Karen Kays., CCC/SLP, PPS Coordinator.  Information including medical coding, functional ability and quality indicators will be reviewed and updated through discharge.

## 2024-09-14 NOTE — Plan of Care (Signed)
  Problem: RH BOWEL ELIMINATION Goal: RH STG MANAGE BOWEL WITH ASSISTANCE Description: STG Manage Bowel with minimal Assistance. Outcome: Progressing   Problem: RH BLADDER ELIMINATION Goal: RH STG MANAGE BLADDER WITH ASSISTANCE Description: STG Manage Bladder With  minimal Assistance Outcome: Progressing   Problem: RH SKIN INTEGRITY Goal: RH STG SKIN FREE OF INFECTION/BREAKDOWN Description: Manage skin free of infection/ breakdown with min assistance Outcome: Progressing   Problem: RH SAFETY Goal: RH STG ADHERE TO SAFETY PRECAUTIONS W/ASSISTANCE/DEVICE Description: STG Adhere to Safety Precautions With min Assistance/Device. Outcome: Progressing
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
## 2024-09-14 NOTE — Plan of Care (Signed)
  Problem: RH Balance Goal: LTG: Patient will maintain dynamic sitting balance (OT) Description: LTG:  Patient will maintain dynamic sitting balance with assistance during activities of daily living (OT) Flowsheets (Taken 09/14/2024 1520) LTG: Pt will maintain dynamic sitting balance during ADLs with: Supervision/Verbal cueing   Problem: RH Grooming Goal: LTG Patient will perform grooming w/assist,cues/equip (OT) Description: LTG: Patient will perform grooming with assist, with/without cues using equipment (OT) Flowsheets (Taken 09/14/2024 1520) LTG: Pt will perform grooming with assistance level of: Set up assist    Problem: RH Bathing Goal: LTG Patient will bathe all body parts with assist levels (OT) Description: LTG: Patient will bathe all body parts with assist levels (OT) Flowsheets (Taken 09/14/2024 1520) LTG: Pt will perform bathing with assistance level/cueing: Minimal Assistance - Patient > 75% LTG: Position pt will perform bathing:  At sink  Lateral leans Note: With long handled sponge   Problem: RH Dressing Goal: LTG Patient will perform upper body dressing (OT) Description: LTG Patient will perform upper body dressing with assist, with/without cues (OT). Flowsheets (Taken 09/14/2024 1520) LTG: Pt will perform upper body dressing with assistance level of: Independent with assistive device Goal: LTG Patient will perform lower body dressing w/assist (OT) Description: LTG: Patient will perform lower body dressing with assist, with/without cues in positioning using equipment (OT) Flowsheets (Taken 09/14/2024 1520) LTG: Pt will perform lower body dressing with assistance level of: Minimal Assistance - Patient > 75%   Problem: RH Toileting Goal: LTG Patient will perform toileting task (3/3 steps) with assistance level (OT) Description: LTG: Patient will perform toileting task (3/3 steps) with assistance level (OT)  Flowsheets (Taken 09/14/2024 1520) LTG: Pt will perform  toileting task (3/3 steps) with assistance level: Moderate Assistance - Patient 50 - 74%   Problem: RH Functional Use of Upper Extremity Goal: LTG Patient will use RT/LT upper extremity as a (OT) Description: LTG: Patient will use right/left upper extremity as a stabilizer/gross assist/diminished/nondominant/dominant level with assist, with/without cues during functional activity (OT) Flowsheets (Taken 09/14/2024 1523) LTG: Use of upper extremity in functional activities: LUE as a stabilizer LTG: Pt will use upper extremity in functional activity with assistance level of: Supervision/Verbal cueing   Problem: RH Toilet Transfers Goal: LTG Patient will perform toilet transfers w/assist (OT) Description: LTG: Patient will perform toilet transfers with assist, with/without cues using equipment (OT) Flowsheets (Taken 09/14/2024 1523) LTG: Pt will perform toilet transfers with assistance level of: Minimal Assistance - Patient > 75%
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
## 2024-09-15 ENCOUNTER — Inpatient Hospital Stay (HOSPITAL_COMMUNITY)

## 2024-09-15 DIAGNOSIS — T07XXXA Unspecified multiple injuries, initial encounter: Secondary | ICD-10-CM | POA: Diagnosis not present

## 2024-09-15 LAB — URINALYSIS, ROUTINE W REFLEX MICROSCOPIC
Bilirubin Urine: NEGATIVE
Glucose, UA: NEGATIVE mg/dL
Hgb urine dipstick: NEGATIVE
Ketones, ur: NEGATIVE mg/dL
Leukocytes,Ua: NEGATIVE
Nitrite: NEGATIVE
Protein, ur: NEGATIVE mg/dL
Specific Gravity, Urine: 1.008 (ref 1.005–1.030)
pH: 7 (ref 5.0–8.0)

## 2024-09-15 MED ORDER — DICLOFENAC SODIUM 1 % EX GEL: 2.0000 g | Freq: Four times a day (QID) | CUTANEOUS | Status: DC | PRN

## 2024-09-15 MED ORDER — OXYCODONE HCL ER 10 MG PO T12A
10.0000 mg | EXTENDED_RELEASE_TABLET | Freq: Two times a day (BID) | ORAL | Status: DC
Start: 2024-09-15 — End: 2024-09-21
  Administered 2024-09-15 – 2024-09-21 (×13): 10 mg via ORAL
  Filled 2024-09-15 (×14): qty 1
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
## 2024-09-15 NOTE — Progress Notes (Signed)
 Patient ID: Rhonda Morales, female   DOB: [birth year], 73 y.o.   MRN: [id number]  [id number] spoke with pt son [name] to introduce self, explain role, discuss discharge process, inform on ELOS, and discuss family edu. He and his brother confirm they will be available to help their mother. Fam edu on Wed (10/22) 10am-12pm.   Graeme Jude, MSW, LCSW Office: [phone] Cell: [phone] Fax: [phone]

## 2024-09-15 NOTE — Progress Notes (Signed)
 Met with patient to review current situation, team conference and plan of care. Reviewed weight bearing restrictions, splints, pain , skin, bowel and bladder. Continue to follow along to provide educational needs to facilitate preparation for discharge.

## 2024-09-15 NOTE — Progress Notes (Signed)
 Pt c/o insomnia even with Seroquel  12.5 mg prn at bedtime. Pt stated she takes 25 mg Seroquel  at home.

## 2024-09-15 NOTE — Care Management (Signed)
 Inpatient Rehabilitation Center Individual Statement of Services  Patient Name:  Rhonda Morales  Date:  09/15/2024  Welcome to the Inpatient Rehabilitation Center.  Our goal is to provide you with an individualized program based on your diagnosis and situation, designed to meet your specific needs.  With this comprehensive rehabilitation program, you will be expected to participate in at least 3 hours of rehabilitation therapies Monday-Friday, with modified therapy programming on the weekends.  Your rehabilitation program will include the following services:  Physical Therapy (PT), Occupational Therapy (OT), 24 hour per day rehabilitation nursing, Therapeutic Recreaction (TR), Psychology, Neuropsychology, Care Coordinator, Rehabilitation Medicine, Nutrition Services, Pharmacy Services, and Other  Weekly team conferences will be held on Tuesday to discuss your progress.  Your Inpatient Rehabilitation Care Coordinator will talk with you frequently to get your input and to update you on team discussions.  Team conferences with you and your family in attendance may also be held.  Expected length of stay: 10-14 days    Overall anticipated outcome: Supervision  Depending on your progress and recovery, your program may change. Your Inpatient Rehabilitation Care Coordinator will coordinate services and will keep you informed of any changes. Your Inpatient Rehabilitation Care Coordinator's name and contact numbers are listed  below.  The following services may also be recommended but are not provided by the Inpatient Rehabilitation Center:  Driving Evaluations Home Health Rehabiltiation Services Outpatient Rehabilitation Services Vocational Rehabilitation   Arrangements will be made to provide these services after discharge if needed.  Arrangements include referral to agencies that provide these services.  Your insurance has been verified to be:  Medicare A/B  Your primary doctor is:  [facility]  Pertinent information will be shared with your doctor and your insurance company.  Inpatient Rehabilitation Care Coordinator:  Graeme Jude, KEN [phone] or [phone]  Information discussed with and copy given to patient by: Graeme DELENA Jude, 09/15/2024, 9:05 AM

## 2024-09-15 NOTE — Progress Notes (Signed)
 Occupational Therapy Session Note  Patient Details  Name: Rhonda Morales MRN: [id number] Date of Birth: [birth year]  Today's Date: 09/15/2024 OT Individual Time: 1300-1415 OT Individual Time Calculation (min): 75 min    Short Term Goals: Week 1:  OT Short Term Goal 1 (Week 1): patient will complete LB dressing with max A OT Short Term Goal 2 (Week 1): patient will verbalize all precautions and demonstrate safety throughout session OT Short Term Goal 3 (Week 1): patient  will complete toileting max A OT Short Term Goal 4 (Week 1): patient will complete bathing routine for LB with max A  Skilled Therapeutic Interventions/Progress Updates:    1:! Pt received in bed. Discussed goals, progression. Pt reports pain in left leg and had an xray earlier. Assisted with re wrapping all her ace wraps on left LE, left knee and right knee. Educated on proper placement of left KI. Discussed that she has been using the STEDY however with using the STEDY her left knee has been bending per pt and nursing. Discussed either the maxi move or squat pivot with +2. Pt got to EOB with min A for assistance with moving her left LE. With her left UE over therapist pt assisted over to drop arm commode with mod A with extra time to her right- however with increased pain in left Le pt put down her right foot - difficulty with maintaining precautions. Toileting with total A. Pt performed mod A +2 back to the bed with same manner with extra time and was able to maintain precautions. Pt assisted with max A back into supine. Applied ice to left hip and knee and help with positioning for pain.   Therapy Documentation Precautions:  Precautions Precautions: Fall, Posterior Hip Precaution/Restrictions Comments: reviewed posterior hip precautions LLE, per ortho posterior hip precautions x6 weeks and KI x2 weeks Required Braces or Orthoses: Knee Immobilizer - Left Knee Immobilizer - Left: On at all times Restrictions Weight Bearing  Restrictions Per Provider Order: Yes LUE Weight Bearing Per Provider Order: Weight bear through elbow only RLE Weight Bearing Per Provider Order: Non weight bearing LLE Weight Bearing Per Provider Order: Weight bearing as tolerated  Pain: Pain Assessment Pain Scale: 0-10 Pain Score: 9  Pain Location: Leg Pain Intervention(s): Medication (See eMAR) Applied ice to left hip and knee  Therapy/Group: Individual Therapy  Claudene Nest [facility] 09/15/2024, 3:34 PM

## 2024-09-15 NOTE — Progress Notes (Signed)
 Inpatient Rehabilitation Care Coordinator Assessment and Plan Patient Details  Name: Rhonda Morales MRN: [id number] Date of Birth: 09/03/1951  Today's Date: 09/15/2024  Hospital Problems: Principal Problem:   Critical polytrauma  Past Medical History:  Past Medical History:  Diagnosis Date   Anxiety    Benign meningioma of brain (HCC)    Benign neoplasm of cerebral meninges (HCC)    Bilateral hearing loss    Chronic interstitial cystitis    Chronic pain    Conductive hearing loss    Conductive hearing loss in left ear    Essential hypertension    GERD (gastroesophageal reflux disease)    Grade I diastolic dysfunction    HLD (hyperlipidemia)    HTN (hypertension)    Hyperlipidemia 05/04/2021   Idiopathic osteoarthritis    Interstitial cystitis 05/04/2021   Left-sided weakness    Low back pain    Mild major depression, single episode    Past Surgical History:  Past Surgical History:  Procedure Laterality Date   ABDOMINAL HYSTERECTOMY     BACK SURGERY     X2   CESAREAN SECTION     X2   CHOLECYSTECTOMY     CLOSED REDUCTION FINGER WITH PERCUTANEOUS PINNING  09/09/2024   Procedure: CLOSED REDUCTION, FINGER, WITH PERCUTANEOUS PINNING RIGHT RING FINGER;  Surgeon: Murrell Drivers, MD;  Location: [facility] OR;  Service: Orthopedics;;   ESOPHAGOGASTRODUODENOSCOPY N/A 07/23/2024   Procedure: EGD (ESOPHAGOGASTRODUODENOSCOPY);  Surgeon: Cindie Carlin POUR, DO;  Location: [facility];  Service: Endoscopy;  Laterality: N/A;  1:00pm, asa 2   EXTERNAL FIXATION, ANKLE Right 09/09/2024   Procedure: OPEN REDUCTION INTERNAL FIXATION, RIGHT ANKLE;  Surgeon: Sharl Selinda Dover, MD;  Location: [facility] OR;  Service: Orthopedics;  Laterality: Right;   INCISION AND DRAINAGE OF WOUND Left 09/09/2024   Procedure: IRRIGATION AND DEBRIDEMENT LEFT LONG FINGER OPEN WOUND;  Surgeon: Murrell Drivers, MD;  Location: [facility] OR;  Service: Orthopedics;  Laterality: Left;  LEFT LONG FINGER   IRRIGATION AND DEBRIDEMENT POSTERIOR  HIP Right 09/09/2024   Procedure: IRRIGATION AND DEBRIDEMENT RIGHT ANKLE OPEN FRACTURE;  Surgeon: Sharl Selinda Dover, MD;  Location: [facility] OR;  Service: Orthopedics;  Laterality: Right;  RIGHT KNEE AND ANKLE   PERCUTANEOUS PINNING Left 09/09/2024   Procedure: PERCUTANEOUS PINNING LEFT LONG FINGER;  Surgeon: Murrell Drivers, MD;  Location: [facility] OR;  Service: Orthopedics;  Laterality: Left;  LEFT LONG FINGER   REPLACEMENT TOTAL KNEE Right    TOTAL HIP ARTHROPLASTY Left    Social History:  reports that she has never smoked. She has never used smokeless tobacco. She reports that she does not currently use alcohol. She reports that she does not use drugs.  Family / Support Systems Marital Status: Married How Long?: 50+ years Patient Roles: Spouse, Parent Spouse/Significant Other: Christopher Sr (has MS) Children: Chldren- Christopher Raddle (also known as Milderd lives in the home), and [name] (lives in the home; diagnosed with schiophrenai 5 yeras ago; med compliant). 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 - How often do you need to have someone help you when you read instructions, pamphlets, or other written material from your doctor or pharmacy?: Rarely Writes:  Yes Employment Status: Retired Date Retired/Disabled/Unemployed: 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: Denies 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   Abuse/Neglect Abuse/Neglect Assessment Can Be Completed: Yes Physical Abuse: Denies Verbal Abuse: Denies Sexual Abuse: Denies Exploitation of patient/patient's  resources: Denies Self-Neglect: Denies  Patient response to: Social Isolation - How often do you feel lonely or isolated from those around you?: Rarely  Emotional Status Pt's affect, behavior and adjustment status: Pt in good spirits at time of visit Recent Psychosocial Issues: Denies Psychiatric History: PT reports she has anxiety and takes Xanax  PRN prescribed by PCP Substance Abuse History: Pt reports she quit smoking cigarettes 10 yrs ago. Denies etoh and rec drug use.  Patient / Family Perceptions, Expectations & Goals Pt/Family understanding of illness & functional limitations: Pt and family have a general understanding of care needs Premorbid pt/family roles/activities: Independent Anticipated changes in roles/activities/participation: Assistance with ADLs/IADLs Pt/family expectations/goals: Pt goal is to work on goign to the bathroom on her own, and being able to get up and walk.  Community Resources [employer]: None Premorbid Home Care/DME Agencies: None Transportation available at discharge: TBD Is the patient able to respond to transportation needs?: Yes In the past 12 months, has lack of transportation kept you from medical appointments or from getting medications?: No In the past 12 months, has lack of transportation kept you from meetings, work, or from getting things needed for daily living?: No Resource referrals recommended: Neuropsychology  Discharge Planning Living Arrangements: Spouse/significant other, Children Support Systems: Spouse/significant other, Children Type of Residence: Private residence Insurance Resources: Electrical engineer Resources: Restaurant manager, fast food Screen Referred: Yes Living Expenses: Mortgage Money Management: Patient Does the patient have any problems obtaining your medications?: No Home Management: Pt manages all hoem care needs Patient/Family Preliminary Plans: husband and children can assist Care Coordinator Barriers  to Discharge: Decreased caregiver support, Lack of/limited family support Care Coordinator Anticipated Follow Up Needs: HH/OP Expected length of stay: 10-14 days  Clinical Impression SW met with pt at bedside. Pt is not a Cytogeneticist. HCPOA is her husband Christopher. Pt DME- RW (from hip replacement in January), and access to shower chair as her husband uses.   Ly Wass A Evolette Pendell 09/15/2024, 1:50 PM

## 2024-09-15 NOTE — Plan of Care (Signed)
  Problem: Consults Goal: RH GENERAL PATIENT EDUCATION Description: See Patient Education module for education specifics. Outcome: Progressing   Problem: RH BOWEL ELIMINATION Goal: RH STG MANAGE BOWEL WITH ASSISTANCE Description: STG Manage Bowel with minimal Assistance. Outcome: Progressing   Problem: RH BLADDER ELIMINATION Goal: RH STG MANAGE BLADDER WITH ASSISTANCE Description: STG Manage Bladder With  minimal Assistance Outcome: Progressing   Problem: RH SKIN INTEGRITY Goal: RH STG SKIN FREE OF INFECTION/BREAKDOWN Description: Manage skin free of infection/ breakdown with min assistance Outcome: Progressing   Problem: RH SAFETY Goal: RH STG ADHERE TO SAFETY PRECAUTIONS W/ASSISTANCE/DEVICE Description: STG Adhere to Safety Precautions With min Assistance/Device. Outcome: Progressing   Problem: RH PAIN MANAGEMENT Goal: RH STG PAIN MANAGED AT OR BELOW PT'S PAIN GOAL Description: <4 w/ prns Outcome: Progressing   Problem: RH KNOWLEDGE DEFICIT GENERAL Goal: RH STG INCREASE KNOWLEDGE OF SELF CARE AFTER HOSPITALIZATION Outcome: Progressing

## 2024-09-15 NOTE — Progress Notes (Signed)
 Occupational Therapy Session Note  Patient Details  Name: Rhonda Morales MRN: [id number] Date of Birth: 02-24-1951  Today's Date: 09/15/2024 OT Individual Time: 1045-1100 OT Individual Time Calculation (min): 15 min  and Today's Date: 09/15/2024 OT Missed Time: 60 Minutes Missed Time Reason: Pain   Short Term Goals: Week 1:  OT Short Term Goal 1 (Week 1): patient will complete LB dressing with max A OT Short Term Goal 2 (Week 1): patient will verbalize all precautions and demonstrate safety throughout session OT Short Term Goal 3 (Week 1): patient  will complete toileting max A OT Short Term Goal 4 (Week 1): patient will complete bathing routine for LB with max A  Skilled Therapeutic Interventions/Progress Updates:    Pt resting in bed upon arrival. Pt returned from xray earlier (LLE). See pain below. Awaiting results of xray regarding WB status on LLE. Discussed OT goals and discharge plans. Pt missed 60 mins skilled OT services 2/2 increased pain. Will attempt to see pt later as schedule allows.   Therapy Documentation Precautions:  Precautions Precautions: Fall, Posterior Hip Precaution/Restrictions Comments: reviewed posterior hip precautions LLE, per ortho posterior hip precautions x6 weeks and KI x2 weeks Required Braces or Orthoses: Knee Immobilizer - Left Knee Immobilizer - Left: On at all times Restrictions Weight Bearing Restrictions Per Provider Order: Yes LUE Weight Bearing Per Provider Order: Weight bear through elbow only RLE Weight Bearing Per Provider Order: Non weight bearing LLE Weight Bearing Per Provider Order: Weight bearing as tolerated General: General OT Amount of Missed Time: 60 Minutes PT Missed Treatment Reason: Pain   Pain: Pt c/o 9/10 pain (generalized); meds admin prior to therapy but pt reports no change in pain level; emotional support   Therapy/Group: Individual Therapy  Maritza Debby Mare 09/15/2024, 11:27 AM

## 2024-09-15 NOTE — Progress Notes (Signed)
 Physical Therapy Session Note  Patient Details  Name: Rhonda Morales MRN: [id number] Date of Birth: November 01, 1951  Today's Date: 09/15/2024 PT Individual Time: 0830-0907 PT Individual Time Calculation (min): 37 min   Short Term Goals: Week 1:  PT Short Term Goal 1 (Week 1): Pt will transfer sup to sit w/ min A consistently from flat bed. PT Short Term Goal 2 (Week 1): Pt will transfer bed <> w/c w/ min A. PT Short Term Goal 3 (Week 1): PT to assess sit to stand/SPT w/ platform walker.  Skilled Therapeutic Interventions/Progress Updates:  Pt in bed when PT arrived.  Pt was told to have bed therapy until her left ankle/leg get an xray. Pt has KI brace LLE.   Pt c/o constant 9/10 described as achy, throbbing mostly LLE unable to eat breakfast. PT discussed with pt her hip precautions, postioning in bed, importance of mobility/ROM.  Pt in agreement.   Pt on left ankle ABC's, DF/PF; right LE heel slides, Glute squeezes. PT performed PROM: RLE hip/knee flexion/extension.      Therapy Documentation Precautions:  Precautions Precautions: Fall, Posterior Hip Precaution/Restrictions Comments: reviewed posterior hip precautions LLE, per ortho posterior hip precautions x6 weeks and KI x2 weeks Required Braces or Orthoses: Knee Immobilizer - Left Knee Immobilizer - Left: On at all times Restrictions Weight Bearing Restrictions Per Provider Order: Yes LUE Weight Bearing Per Provider Order: Weight bear through elbow only RLE Weight Bearing Per Provider Order: Non weight bearing LLE Weight Bearing Per Provider Order: Weight bearing as tolerated    Pain: Pain Assessment Pain Scale: 0-10 Pain Score: Worst pain ever Pain Location: Generalized Pain Intervention(s): Medication (See eMAR)    Therapy/Group: Individual Therapy  Arland GORMAN Fast 09/15/2024, 9:04 AM

## 2024-09-16 ENCOUNTER — Inpatient Hospital Stay (HOSPITAL_COMMUNITY)

## 2024-09-16 DIAGNOSIS — T07XXXA Unspecified multiple injuries, initial encounter: Secondary | ICD-10-CM | POA: Diagnosis not present

## 2024-09-16 LAB — BASIC METABOLIC PANEL WITH GFR
Anion gap: 9 (ref 5–15)
BUN: 14 mg/dL (ref 8–23)
CO2: 25 mmol/L (ref 22–32)
Calcium: 8.6 mg/dL — ABNORMAL LOW (ref 8.9–10.3)
Chloride: 106 mmol/L (ref 98–111)
Creatinine, Ser: 0.6 mg/dL (ref 0.44–1.00)
GFR, Estimated: >60 mL/min (ref >60)
Glucose, Bld: 116 mg/dL — ABNORMAL HIGH (ref 70–99)
Potassium: 3.7 mmol/L (ref 3.5–5.1)
Sodium: 140 mmol/L (ref 135–145)

## 2024-09-16 LAB — CBC WITH DIFFERENTIAL/PLATELET
Abs Immature Granulocytes: 0.42 K/uL — ABNORMAL HIGH (ref 0.00–0.07)
Basophils Absolute: 0 K/uL (ref 0.0–0.1)
Basophils Relative: 0 %
Eosinophils Absolute: 0.2 K/uL (ref 0.0–0.5)
Eosinophils Relative: 2 %
HCT: 26.2 % — ABNORMAL LOW (ref 36.0–46.0)
Hemoglobin: 8.3 g/dL — ABNORMAL LOW (ref 12.0–15.0)
Immature Granulocytes: 5 %
Lymphocytes Relative: 18 %
Lymphs Abs: 1.7 K/uL (ref 0.7–4.0)
MCH: 27.9 pg (ref 26.0–34.0)
MCHC: 31.7 g/dL (ref 30.0–36.0)
MCV: 87.9 fL (ref 80.0–100.0)
Monocytes Absolute: 1 K/uL (ref 0.1–1.0)
Monocytes Relative: 10 %
Neutro Abs: 6.1 K/uL (ref 1.7–7.7)
Neutrophils Relative %: 65 %
Platelets: 205 K/uL (ref 150–400)
RBC: 2.98 MIL/uL — ABNORMAL LOW (ref 3.87–5.11)
RDW: 15 % (ref 11.5–15.5)
WBC: 9.3 K/uL (ref 4.0–10.5)
nRBC: 0.2 % (ref 0.0–0.2)

## 2024-09-16 LAB — URINE CULTURE: Culture: NO GROWTH

## 2024-09-16 MED ORDER — QUETIAPINE FUMARATE 25 MG PO TABS
25.0000 mg | ORAL_TABLET | Freq: Every day | ORAL | Status: DC
  Administered 2024-09-16 – 2024-09-20 (×5): 50 mg via ORAL
  Filled 2024-09-16 (×6): qty 2

## 2024-09-16 NOTE — Plan of Care (Signed)
  Problem: Consults Goal: RH GENERAL PATIENT EDUCATION Description: See Patient Education module for education specifics. Outcome: Progressing   Problem: RH BOWEL ELIMINATION Goal: RH STG MANAGE BOWEL WITH ASSISTANCE Description: STG Manage Bowel with minimal Assistance. Outcome: Progressing   Problem: RH BLADDER ELIMINATION Goal: RH STG MANAGE BLADDER WITH ASSISTANCE Description: STG Manage Bladder With  minimal Assistance Outcome: Progressing   Problem: RH SKIN INTEGRITY Goal: RH STG SKIN FREE OF INFECTION/BREAKDOWN Description: Manage skin free of infection/ breakdown with min assistance Outcome: Progressing   Problem: RH SAFETY Goal: RH STG ADHERE TO SAFETY PRECAUTIONS W/ASSISTANCE/DEVICE Description: STG Adhere to Safety Precautions With min Assistance/Device. Outcome: Progressing   Problem: RH PAIN MANAGEMENT Goal: RH STG PAIN MANAGED AT OR BELOW PT'S PAIN GOAL Description: <4 w/ prns Outcome: Progressing   Problem: RH KNOWLEDGE DEFICIT GENERAL Goal: RH STG INCREASE KNOWLEDGE OF SELF CARE AFTER HOSPITALIZATION Outcome: Progressing

## 2024-09-16 NOTE — Progress Notes (Signed)
 Occupational Therapy Session Note  Patient Details  Name: KIANNAH GRUNOW MRN: [id number] Date of Birth: October 05, 1951  Today's Date: 09/16/2024 OT Individual Time: 8954-8879 OT Individual Time Calculation (min): 35 min  and Today's Date: 09/16/2024 OT Missed Time: 40 Minutes Missed Time Reason: CT/MRI   Short Term Goals: Week 1:  OT Short Term Goal 1 (Week 1): patient will complete LB dressing with max A OT Short Term Goal 2 (Week 1): patient will verbalize all precautions and demonstrate safety throughout session OT Short Term Goal 3 (Week 1): patient  will complete toileting max A OT Short Term Goal 4 (Week 1): patient will complete bathing routine for LB with max A  Skilled Therapeutic Interventions/Progress Updates:    Pt resting in bed upon arrival. Skilled OT intervention with focus on discharge planning. LLE WB status changed to TDWB earlier in day. Discussed use of TB for transfers. Discussed use of DABSC for toileting. Recommended pt wear house coat or personal gown for transfers to simplify transfer/process. Discussed sons (2) coming in for education. Discussed with CSW scheduling family ed for early next week. Transporter arrived for MRI. Pt missed 40 mins skilled OT services. Will attempt to see again as schedule allows.   Therapy Documentation Precautions:  Precautions Precautions: Fall, Posterior Hip Precaution/Restrictions Comments: reviewed posterior hip precautions LLE, per ortho posterior hip precautions x6 weeks and KI x2 weeks Required Braces or Orthoses: Knee Immobilizer - Left Knee Immobilizer - Left: On at all times Restrictions Weight Bearing Restrictions Per Provider Order: Yes LUE Weight Bearing Per Provider Order: Weight bear through elbow only RLE Weight Bearing Per Provider Order: Non weight bearing LLE Weight Bearing Per Provider Order: Weight bearing as tolerated General: General OT Amount of Missed Time: 40 Minutes Pain: Pt c/o Lt knee pain (ice  applied) but Lt hip feeling better at rest  Therapy/Group: Individual Therapy  Maritza Debby Mare 09/16/2024, 11:30 AM

## 2024-09-16 NOTE — Progress Notes (Signed)
 Occupational Therapy Session Note  Patient Details  Name: Rhonda Morales MRN: [id number] Date of Birth: Aug 23, 1951  Today's Date: 09/16/2024 OT Individual Time: 1300-1325 OT Individual Time Calculation (min): 25 min    Short Term Goals: Week 1:  OT Short Term Goal 1 (Week 1): patient will complete LB dressing with max A OT Short Term Goal 2 (Week 1): patient will verbalize all precautions and demonstrate safety throughout session OT Short Term Goal 3 (Week 1): patient  will complete toileting max A OT Short Term Goal 4 (Week 1): patient will complete bathing routine for LB with max A  Skilled Therapeutic Interventions/Progress Updates:    Pt resting in bed upon arrival with family present. Reviewed DME recommendations and assisted pt with repositioning in bed. MRI results not avail. Discussed use of DABSC at home with transfer via TB. Pt remained in bed with all needs within reach.   Therapy Documentation Precautions:  Precautions Precautions: Fall, Posterior Hip Precaution/Restrictions Comments: reviewed posterior hip precautions LLE, per ortho posterior hip precautions x6 weeks and KI x2 weeks Required Braces or Orthoses: Knee Immobilizer - Left Knee Immobilizer - Left: On at all times Restrictions Weight Bearing Restrictions Per Provider Order: Yes LUE Weight Bearing Per Provider Order: Weight bear through elbow only RLE Weight Bearing Per Provider Order: Non weight bearing LLE Weight Bearing Per Provider Order: Weight bearing as tolerated   Pain: Pt c/o 8/10 pain in LLE; ice applied and repositoned   Therapy/Group: Individual Therapy  Maritza Debby Mare 09/16/2024, 2:26 PM
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
## 2024-09-16 NOTE — Progress Notes (Signed)
 Patient ID: Rhonda Morales, female   DOB: Feb 13, 1951, 73 y.o.   MRN: [id number]  Received request for knee injection of left knee in this pt known to orthopedics. Unfortunately MRI showed impaction fx so injection contraindicated. She may still WBAT on that leg.    Ozell DOROTHA Ned, PA-C Orthopedic Surgery [phone]

## 2024-09-16 NOTE — Progress Notes (Signed)
 Physical Therapy Session Note  Patient Details  Name: Rhonda Morales MRN: [id number] Date of Birth: Jan 19, 1951  Today's Date: 09/16/2024 PT Individual Time: 0900-1015, 1445-1530 PT Individual Time Calculation (min): 75 min, 45 min   Short Term Goals: Week 1:  PT Short Term Goal 1 (Week 1): Pt will transfer sup to sit w/ min A consistently from flat bed. PT Short Term Goal 2 (Week 1): Pt will transfer bed <> w/c w/ min A. PT Short Term Goal 3 (Week 1): PT to assess sit to stand/SPT w/ platform walker.  Skilled Therapeutic Interventions/Progress Updates:    Session 1:  pt received in bed and agreeable to therapy. Pt reports 6/10 pain, premedicated. Rest and positioning provided as needed. Pt reports MD has changed WB status and not to get up because of L hip fx. Confirmed during session that pt has fx in pelvis (not new) and that MD has changed WB to TDWB for LLE. Discussed pt's options for transfer with only having her RUE for transfer: hoyer or heavily assisted board transfer. Plan to trial with board in the pm. At this time, pt performed LLE ankle pumps and circles to improve circulation and maintain ankle strength. For RLE, gave pt program of heel slides, SLR, IR/ER and abduction to maintain RLE strength and ROM while non weight bearing. Discussed frequency and duration, pt expressed understanding. Pt was left with all needs in reach and alarm active, heels floated.   Session 2: pt received in bed and agreeable to therapy. Pt reports 8/10 pain, premedicated. Rest and positioning provided as needed. Session focused on attempting slideboard transfer. Min a for bed mobility to manage LLE. Pt participated in slideboard transfer using RUE to push, but required tot a +2 to complete slide to w/c using chuck pad. She was extremely limited by pain and could not shift weight toward L side at all while seated EOB or in w/c, requiring min a for balance and LE management while chair was turned to return to  bed same direction. Expect pt will need +2 assist from family for any OOB mobility with a board d/t new WB precautions and high pain levels. Pt returned to bed and was provided ice for pain management at hip. Left in bed with needs in reach, alarm active, and heels floated.   Therapy Documentation Precautions:  Precautions Precautions: Fall, Posterior Hip Precaution/Restrictions Comments: reviewed posterior hip precautions LLE, per ortho posterior hip precautions x6 weeks and KI x2 weeks Required Braces or Orthoses: Knee Immobilizer - Left Knee Immobilizer - Left: On at all times Restrictions Weight Bearing Restrictions Per Provider Order: Yes LUE Weight Bearing Per Provider Order: Weight bear through elbow only RLE Weight Bearing Per Provider Order: Non weight bearing LLE Weight Bearing Per Provider Order: Weight bearing as tolerated General:       Therapy/Group: Individual Therapy  Lindsie Simar C Marlisha Vanwyk 09/16/2024, 4:51 PM
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
## 2024-09-16 NOTE — Progress Notes (Signed)
 Patient ID: Rhonda Morales, female   DOB: [birth year], 73 y.o.   MRN: [id number]  Therapy team recommended an earlier family education day due to patient requiring more assistance due to new WB restrictions and pt being Max A. SW will follow-up with family.    1236- SW called pt son [name] several times to make contact but unable to leave VM as VM not setup. SW will continue to make efforts to contact.  Graeme Jude, MSW, LCSW Office: [phone] Cell: [phone] Fax: [phone]

## 2024-09-17 DIAGNOSIS — T07XXXA Unspecified multiple injuries, initial encounter: Secondary | ICD-10-CM | POA: Diagnosis not present

## 2024-09-17 MED ORDER — DICLOFENAC SODIUM 1 % EX GEL
4.0000 g | Freq: Four times a day (QID) | CUTANEOUS | Status: DC
  Administered 2024-09-17 – 2024-09-21 (×6): 4 g via TOPICAL
  Filled 2024-09-17: qty 100
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
## 2024-09-17 NOTE — Progress Notes (Signed)
 Patient ID: Rhonda Morales, female   DOB: December 21, 1950, 73 y.o.   MRN: [id number]   1046-[id number] called pt son Rhonda Morales several times to make contact but unable to leave VM as VM is full SW will continue to make efforts to contact.   *SW met with pt in room to discuss discharge plan. Reports she intends to go home. She discussed family edu with PT and she intends to ask her sons to be here on Monday at 815am. SW shared above about challenges with connecting with her sons.   *SW received updates from therapy team reporting pt will need ambulance transportation home, hospital bed, w/c, and DABSC.   1221- SW spoke with pt son Rhonda Morales to inform on above. He will be here on Monday for family edu.   [facility] ambulance pick up for Tuesday at 11am.   SW ordered above DME with [employer] via parachute.  Graeme Jude, MSW, LCSW Office: [phone] Cell: [phone] Fax: [phone]

## 2024-09-17 NOTE — Progress Notes (Signed)
 Physical Therapy Session Note  Patient Details  Name: TOMMYE LEHENBAUER MRN: [id number] Date of Birth: 11-30-1951  Today's Date: 09/17/2024 PT Individual Time: 1100-1200, 1300-1400 PT Individual Time Calculation (min): 60 min, 60 min   Short Term Goals: Week 1:  PT Short Term Goal 1 (Week 1): Pt will transfer sup to sit w/ min A consistently from flat bed. PT Short Term Goal 2 (Week 1): Pt will transfer bed <> w/c w/ min A. PT Short Term Goal 3 (Week 1): PT to assess sit to stand/SPT w/ platform walker.  Skilled Therapeutic Interventions/Progress Updates:    Session 1: pt received in bed and agreeable to therapy. Pt reports 6/10, premedicated. Rest and positioning provided as needed.   Extended discussion of discharge plan and time spent scheduling family education for Monday.   Pt assisted into R side lying with pillows to prevent adduction and issued wedge pillow for positioning. Discussed turning for skin protection and comfort. In sidelying, R active adduction 3 x 10, active assist L abduction, 2 x 5. Pt remained in bed at end of session, was left with all needs in reach and alarm active.     Session 2: pt received in bed and agreeable to therapy. Pt reports 8/10 pain, requested pain medication from nsg. Bed level therapy d/t high pain levels.  Extended discussion during session on d/c planning including: turning for skin protection, upright positioning for overall health and maintain upright tolerance  Pt performed the following exercises to promote LE strength and endurance:  -rolling to L side with mod a, unable to maintain d/t pain -SLR BIL, L side limited by pain -ankle circles LLE -RLE heel slides  Pt remained in bed at end of session, was left with all needs in reach and alarm active.     Therapy Documentation Precautions:  Precautions Precautions: Fall, Posterior Hip Precaution/Restrictions Comments: reviewed posterior hip precautions LLE, per ortho posterior hip  precautions x6 weeks and KI x2 weeks Required Braces or Orthoses: Knee Immobilizer - Left Knee Immobilizer - Left: On at all times Restrictions Weight Bearing Restrictions Per Provider Order: Yes LUE Weight Bearing Per Provider Order: Weight bear through elbow only RLE Weight Bearing Per Provider Order: Non weight bearing LLE Weight Bearing Per Provider Order: Weight bearing as tolerated General:     Therapy/Group: Individual Therapy  Schuyler JAYSON Batter 09/17/2024, 1:29 PM

## 2024-09-17 NOTE — Progress Notes (Signed)
 Occupational Therapy Session Note  Patient Details  Name: Rhonda Morales MRN: [id number] Date of Birth: 04-25-1951  Today's Date: 09/17/2024 OT Individual Time: 9184-9074 OT Individual Time Calculation (min): 70 min    Short Term Goals: Week 1:  OT Short Term Goal 1 (Week 1): patient will complete LB dressing with max A OT Short Term Goal 2 (Week 1): patient will verbalize all precautions and demonstrate safety throughout session OT Short Term Goal 3 (Week 1): patient  will complete toileting max A OT Short Term Goal 4 (Week 1): patient will complete bathing routine for LB with max A  Skilled Therapeutic Interventions/Progress Updates:    Pt on bed pan upon arrival. Tot A for toleting at bed level. Discussed DME recommendations. Will f/u with CSW. Pt reported that using TB yesterday was very painful and Lt knee pain was bothersome all night. Readjusted LLE KI with relief noted. Pt issued yellow theraband and demonstrated RUE exercises. Pt return demonstrated RUE circuit therex reaching out and up. Instructed pt to complete 3 sets of 10 2-3 times throughout the day. Pt remained in bed with all needs within reach.   Therapy Documentation Precautions:  Precautions Precautions: Fall, Posterior Hip Precaution/Restrictions Comments: reviewed posterior hip precautions LLE, per ortho posterior hip precautions x6 weeks and KI x2 weeks Required Braces or Orthoses: Knee Immobilizer - Left Knee Immobilizer - Left: On at all times Restrictions Weight Bearing Restrictions Per Provider Order: Yes LUE Weight Bearing Per Provider Order: Weight bear through elbow only RLE Weight Bearing Per Provider Order: Non weight bearing LLE Weight Bearing Per Provider Order: Weight bearing as tolerated   Pain: Pt c/o 8/10 Lt knee pain; pt reports her knee pain kept her up all night long; meds admin prior to therapy; KI readjusted with some relief noted  Therapy/Group: Individual Therapy  Maritza Debby Mare 09/17/2024, 11:01 AM

## 2024-09-17 NOTE — Discharge Instructions (Signed)
 Inpatient Rehab Discharge Instructions  Rhonda Morales  Discharge date and time: 09/21/24  Activities/Precautions/ Functional Status:  Activity: no lifting, driving, or strenuous exercise for until cleared by  MD.  and no driving while on analgesics  Diet: regular diet and encourage fluids  Wound Care: Cleanse wound with saline . Place xeroform, cover with gauze and ace wrap to keep in place.   Functional status:  ___ No restrictions     ___ Walk up steps independently ___ 24/7 supervision/assistance   ___ Walk up steps with assistance ___ Intermittent supervision/assistance  ___ Bathe/dress independently ___ Walk with walker     ___ Bathe/dress with assistance ___ Walk Independently    ___ Shower independently __X_ Walk with assistance    ___ Shower with assistance __X_ No alcohol     ___ Return to work/school ________  Special Instructions:   My questions have been answered and I understand these instructions. I will adhere to these goals and the provided educational materials after my discharge from the hospital.  Patient/Caregiver Signature _______________________________ Date __________  Clinician Signature _______________________________________ Date __________  Please bring this form and your medication list with you to all your follow-up doctor's appointments.      COMMUNITY REFERRALS UPON DISCHARGE:    *ALL THERAPY HAVE BEEN DEFERRED AT THIS TIME DUE TO NON-WEIGHT BEARING RESTRICTIONS. PLEASE REFER TO HOME EXERCISE PROGRAM.*  Medical Equipment/Items Ordered:hospital bed, hoyer lift, drop arm bedside commode, and wheelchair with elevating leg rests                                                 Agency/Supplier: [employer] [facility] # [phone]

## 2024-09-17 NOTE — Progress Notes (Signed)
 Physical Therapy Note  Patient Details  Name: [name] MRN: [id number] Date of Birth: [birth year] Today's Date: 09/17/2024    Pt's plan of care adjusted to 15/7 after speaking with care team and discussed with MD in team conference as pt currently unable to tolerate current therapy schedule with OT, PT, and SLP.     Schuyler JAYSON Batter 09/17/2024, 4:19 PM

## 2024-09-18 DIAGNOSIS — I10 Essential (primary) hypertension: Secondary | ICD-10-CM
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
## 2024-09-18 NOTE — Progress Notes (Signed)
 Physical Therapy Session Note  Patient Details  Name: Rhonda Morales MRN: [id number] Date of Birth: [birth year]  Today's Date: 09/18/2024 PT Individual Time: 1000-1040 PT Individual Time Calculation (min): 40 min   Short Term Goals: Week 1:  PT Short Term Goal 1 (Week 1): Pt will transfer sup to sit w/ min A consistently from flat bed. PT Short Term Goal 2 (Week 1): Pt will transfer bed <> w/c w/ min A. PT Short Term Goal 3 (Week 1): PT to assess sit to stand/SPT w/ platform walker. Week 2:     Skilled Therapeutic Interventions/Progress Updates:    Pt in bed, agreeable for PT.  Pt states her pain isn't bad, I just got pain meds.  It's a 7/10.  Pt requested to brush her teeth.  Pt able to perform indep with set up.  Pt willing to transfer OOB->w/c this morning. LLE immobilizer donned.  Pt requires max A x2 supine<->sit to EOB keeping NWB RLE/LUE. EOB pt able to use RUE to support on bed railing, yelling in pain.  Sliding board attempt towards left into w/c.  Pt required Total A x2 in to w/c, pt refusing to weight bear on LLE.  In w/c, pt screeching in pain to left knee, PT supporting <90 degrees of hip flexion.  Pt able to sit up for few seconds without screaming in pain.  Pt transferred back into bed after ~3' Total A x2 with LLE supported throughout transfer and RLE NWB. Once pt in bed, she stopped shouting immediately, content.  PT discussed discharge plans with pt and she is relying on her sons and husband for care. Pt states she won't be getting OOB anytime soon once discharged.   Therapy Documentation Precautions:  Precautions Precautions: Fall, Posterior Hip Precaution/Restrictions Comments: reviewed posterior hip precautions LLE, per ortho posterior hip precautions x6 weeks and KI x2 weeks Required Braces or Orthoses: Knee Immobilizer - Left Knee Immobilizer - Left: On at all times Restrictions Weight Bearing Restrictions Per Provider Order: Yes LUE Weight Bearing Per Provider  Order: Weight bear through elbow only RLE Weight Bearing Per Provider Order: Non weight bearing LLE Weight Bearing Per Provider Order: Weight bearing as tolerated    Pain: Pain Assessment Pain Scale: 0-10 Pain Score: 7  Pain Location: Generalized Pain Intervention(s): Medication (See eMAR)     Therapy/Group: Individual Therapy  Arland GORMAN Fast 09/18/2024, 12:57 PM

## 2024-09-18 NOTE — Progress Notes (Signed)
 Physical Therapy Session Note  Patient Details  Name: Rhonda Morales MRN: [id number] Date of Birth: 1951/08/30  Today's Date: 09/18/2024 PT Individual Time: 1445-1509 PT Individual Time Calculation (min): 24 min   Short Term Goals: Week 1:  PT Short Term Goal 1 (Week 1): Pt will transfer sup to sit w/ min A consistently from flat bed. PT Short Term Goal 2 (Week 1): Pt will transfer bed <> w/c w/ min A. PT Short Term Goal 3 (Week 1): PT to assess sit to stand/SPT w/ platform walker.  Skilled Therapeutic Interventions/Progress Updates: Patient semi-recline din bed with son's present on entrance to room. Patient alert and agreeable to PT session to best of ability  Patient reported unrated pain with medication already provided. PTA discussed with pt and family on pt's precautions and bed mobility. Pt willing to try bed mobility education with son's. Pt stated they will have hospital bed at home with rails. PTA provided demonstration and cuing to hand placement to transition pt to sitting EOB with HOB elevated. PTA also provided education to move B LE's towards EOB slowly and one at a time to maintain hip precautions. Pt sons participated on mechanics of pt and themselves to avoid back injuries while providing care to pt. Pt required maxA + 2 to transition to EOB and back to semi-reclined with further cueing to use R UE to assist with truncal elevation. Pt family also educated on how to properly assist pt with scooting to EOB (one hip at a time, gently). Pt requested return to laying in bed due to pain with pt sons assisting and PTA providing VC to move pt back to supine with moving B LE's and shoulders in unison.   Patient semi-recline din bed at end of session with brakes locked, sons present, bed alarm set, and all needs within reach.      Therapy Documentation Precautions:  Precautions Precautions: Fall, Posterior Hip Precaution/Restrictions Comments: reviewed posterior hip precautions LLE,  per ortho posterior hip precautions x6 weeks and KI x2 weeks Required Braces or Orthoses: Knee Immobilizer - Left Knee Immobilizer - Left: On at all times Restrictions Weight Bearing Restrictions Per Provider Order: Yes LUE Weight Bearing Per Provider Order: Weight bear through elbow only RLE Weight Bearing Per Provider Order: Non weight bearing LLE Weight Bearing Per Provider Order: Weight bearing as tolerated  Therapy/Group: Individual Therapy  Majesty Oehlert PTA 09/18/2024, 3:17 PM

## 2024-09-18 NOTE — Progress Notes (Signed)
 Occupational Therapy Session Note  Patient Details  Name: Rhonda Morales MRN: [id number] Date of Birth: [birth year]  Today's Date: 09/18/2024 OT Individual Time: 9199-9144 OT Individual Time Calculation (min): 55 min    Short Term Goals: Week 1:  OT Short Term Goal 1 (Week 1): patient will complete LB dressing with max A OT Short Term Goal 2 (Week 1): patient will verbalize all precautions and demonstrate safety throughout session OT Short Term Goal 3 (Week 1): patient  will complete toileting max A OT Short Term Goal 4 (Week 1): patient will complete bathing routine for LB with max A  Skilled Therapeutic Interventions/Progress Updates:    Pt in bed upon arrival, requesting to be removed from bed pan. Linens and pt's gown were soiled with urine. Skilled OT intervention with focus on bed mobility, bathing at bed level, and donning new gown to increase independence with BADLs. All rolling R/L in bed using bed rails with mod A, primarily for LE positioning. Tot A for hygiene. Bed linens changed while pt in bed. Pt completed bathing at bed level with mod A. Pt reports that she will probably use bed pan at home because getting on [facility] is too painful. Family education planned for 10/20. Reviewed RUE therex. Reviewed WB precautions and LLE hip precautions. Pt remained in bed with all needs within reach. Bed alarm activated.   Therapy Documentation Precautions:  Precautions Precautions: Fall, Posterior Hip Precaution/Restrictions Comments: reviewed posterior hip precautions LLE, per ortho posterior hip precautions x6 weeks and KI x2 weeks Required Braces or Orthoses: Knee Immobilizer - Left Knee Immobilizer - Left: On at all times Restrictions Weight Bearing Restrictions Per Provider Order: Yes LUE Weight Bearing Per Provider Order: Weight bear through elbow only RLE Weight Bearing Per Provider Order: Non weight bearing LLE Weight Bearing Per Provider Order: Weight bearing as tolerated    Pain: Pt c/o 8/10 Lt knee pain with activity; repositioned, LPN notified (pt requested pain meds)   Therapy/Group: Individual Therapy  Maritza Debby Mare 09/18/2024, 9:02 AM

## 2024-09-18 NOTE — Progress Notes (Signed)
 Occupational Therapy Session Note  Patient Details  Name: Rhonda Morales MRN: [id number] Date of Birth: August 30, 1951  Today's Date: 09/18/2024 OT Individual Time: 1345-1400 OT Individual Time Calculation (min): 15 min  and Today's Date: 09/18/2024 OT Missed Time: 30 Minutes Missed Time Reason: Pain   Short Term Goals: Week 1:  OT Short Term Goal 1 (Week 1): patient will complete LB dressing with max A OT Short Term Goal 2 (Week 1): patient will verbalize all precautions and demonstrate safety throughout session OT Short Term Goal 3 (Week 1): patient  will complete toileting max A OT Short Term Goal 4 (Week 1): patient will complete bathing routine for LB with max A  Skilled Therapeutic Interventions/Progress Updates:    Pt resting in bed upon arrival. See pain below. Pt reported she was still in considerable pain from early session when practicing TB transfer. Meds admin during session. Reviewed discharge plans and family educaiton. Explained that hoyer lift is safest way to transfer and follow LLE hip precautions. Pt discussed that she would probably use bed pan at home until pain got better during transfers. Advised pt to sit in w/c or chair whenever possible. Pt verbzlied understanding. Pt remained in bed with all needs within reach. Bed alarm activated.   Therapy Documentation Precautions:  Precautions Precautions: Fall, Posterior Hip Precaution/Restrictions Comments: reviewed posterior hip precautions LLE, per ortho posterior hip precautions x6 weeks and KI x2 weeks Required Braces or Orthoses: Knee Immobilizer - Left Knee Immobilizer - Left: On at all times Restrictions Weight Bearing Restrictions Per Provider Order: Yes LUE Weight Bearing Per Provider Order: Weight bear through elbow only RLE Weight Bearing Per Provider Order: Non weight bearing LLE Weight Bearing Per Provider Order: Weight bearing as tolerated General: General OT Amount of Missed Time: 30 Minutes Pain: Pt  c/o 8/10 LLE pain (knee); meds admin during therapy   Therapy/Group: Individual Therapy  Maritza Debby Mare 09/18/2024, 2:10 PM

## 2024-09-20 LAB — CBC WITH DIFFERENTIAL/PLATELET
Abs Immature Granulocytes: 0.14 K/uL — ABNORMAL HIGH (ref 0.00–0.07)
Basophils Absolute: 0 K/uL (ref 0.0–0.1)
Basophils Relative: 0 %
Eosinophils Absolute: 0.2 K/uL (ref 0.0–0.5)
Eosinophils Relative: 1 %
HCT: 30.8 % — ABNORMAL LOW (ref 36.0–46.0)
Hemoglobin: 9.6 g/dL — ABNORMAL LOW (ref 12.0–15.0)
Immature Granulocytes: 1 %
Lymphocytes Relative: 15 %
Lymphs Abs: 1.7 K/uL (ref 0.7–4.0)
MCH: 27.7 pg (ref 26.0–34.0)
MCHC: 31.2 g/dL (ref 30.0–36.0)
MCV: 88.8 fL (ref 80.0–100.0)
Monocytes Absolute: 0.8 K/uL (ref 0.1–1.0)
Monocytes Relative: 7 %
Neutro Abs: 8.2 K/uL — ABNORMAL HIGH (ref 1.7–7.7)
Neutrophils Relative %: 76 %
Platelets: 345 K/uL (ref 150–400)
RBC: 3.47 MIL/uL — ABNORMAL LOW (ref 3.87–5.11)
RDW: 15.1 % (ref 11.5–15.5)
WBC: 11 K/uL — ABNORMAL HIGH (ref 4.0–10.5)
nRBC: 0 % (ref 0.0–0.2)

## 2024-09-20 LAB — BASIC METABOLIC PANEL WITH GFR
Anion gap: 11 (ref 5–15)
BUN: 20 mg/dL (ref 8–23)
CO2: 24 mmol/L (ref 22–32)
Calcium: 9.1 mg/dL (ref 8.9–10.3)
Chloride: 104 mmol/L (ref 98–111)
Creatinine, Ser: 0.59 mg/dL (ref 0.44–1.00)
GFR, Estimated: >60 mL/min (ref >60)
Glucose, Bld: 116 mg/dL — ABNORMAL HIGH (ref 70–99)
Potassium: 4.1 mmol/L (ref 3.5–5.1)
Sodium: 139 mmol/L (ref 135–145)

## 2024-09-20 NOTE — Plan of Care (Signed)
  Problem: Consults Goal: RH GENERAL PATIENT EDUCATION Description: See Patient Education module for education specifics. Outcome: Progressing   Problem: RH BOWEL ELIMINATION Goal: RH STG MANAGE BOWEL WITH ASSISTANCE Description: STG Manage Bowel with minimal Assistance. Outcome: Progressing   Problem: RH BLADDER ELIMINATION Goal: RH STG MANAGE BLADDER WITH ASSISTANCE Description: STG Manage Bladder With  minimal Assistance Outcome: Progressing   Problem: RH SKIN INTEGRITY Goal: RH STG SKIN FREE OF INFECTION/BREAKDOWN Description: Manage skin free of infection/ breakdown with min assistance Outcome: Progressing   Problem: RH SAFETY Goal: RH STG ADHERE TO SAFETY PRECAUTIONS W/ASSISTANCE/DEVICE Description: STG Adhere to Safety Precautions With min Assistance/Device. Outcome: Progressing   Problem: RH PAIN MANAGEMENT Goal: RH STG PAIN MANAGED AT OR BELOW PT'S PAIN GOAL Description: <4 w/ prns Outcome: Progressing   Problem: RH KNOWLEDGE DEFICIT GENERAL Goal: RH STG INCREASE KNOWLEDGE OF SELF CARE AFTER HOSPITALIZATION Outcome: Progressing

## 2024-09-20 NOTE — Progress Notes (Signed)
 Occupational Therapy Session Note  Patient Details  Name: Rhonda Morales MRN: [id number] Date of Birth: 29-Jul-1951  Today's Date: 09/20/2024 OT Individual Time: [phone] OT Individual Time Calculation (min): 70 min    Short Term Goals: Week 1:  OT Short Term Goal 1 (Week 1): patient will complete LB dressing with max A OT Short Term Goal 2 (Week 1): patient will verbalize all precautions and demonstrate safety throughout session OT Short Term Goal 3 (Week 1): patient  will complete toileting max A OT Short Term Goal 4 (Week 1): patient will complete bathing routine for LB with max A  Skilled Therapeutic Interventions/Progress Updates:    Skilled OT intervention with focus on bed mobility, LB dressing, activity tolerance, and discharge planning to increase independence with BADLs. LB dressing, donning pants at bed level. Tot A. Rolling R/L in bed with min A for LE mgmt. Educated pt on use of manual hoyer lift; did not practice. Recommended pt use hoyer for transfers at home for safety and pain mgmt. Pt's sons are coming to PT session for education and practice. Reviewed importance of repositioning in bed for skin integrity. Pt return demonstrated RUE therex with yellow theraband. Pt looking forward to returning home to heal. Pt realistic on time frame for healing and continued therapy. Pt remained in bed with all needs wihtin reach. Bed alarm activated.   Therapy Documentation Precautions:  Precautions Precautions: Fall, Posterior Hip Precaution/Restrictions Comments: reviewed posterior hip precautions LLE, per ortho posterior hip precautions x6 weeks and KI x2 weeks Required Braces or Orthoses: Knee Immobilizer - Left Knee Immobilizer - Left: On at all times Restrictions Weight Bearing Restrictions Per Provider Order: Yes LUE Weight Bearing Per Provider Order: Weight bear through elbow only RLE Weight Bearing Per Provider Order: Non weight bearing LLE Weight Bearing Per Provider Order:  Weight bearing as tolerated   Pain: Pt c/o 5/10 Lt hip pain with activity; repositioned and meds admin prior to therapy  Therapy/Group: Individual Therapy  Maritza Debby Mare 09/20/2024, 9:28 AM

## 2024-09-20 NOTE — Progress Notes (Signed)
 Occupational Therapy Discharge Summary  Patient Details  Name: Rhonda Morales MRN: [id number] Date of Birth: May 29, 1951  Date of Discharge from OT service:September 20, 2024   Patient has met 2 of 8 long term goals due to {due un:6958348}. Pt progress limited by pain and change in LLE WB status during admission. Pt completes ADLs at bed level. Pt unable to tolerate sitting in w/c 2/2 Lt hip pain. TB transfers with tot A and increased paing in Lt knee. Recommend use of manual hoyer lift for transfers at home. Per pt, she doesn't plan on getting OOB until pain better controlled. Educated pt on importance of repositioning to main skin integrity. Bathing with mod A at bed level. UB dressing with min A and LB dressing with tot A at bed level. Pt using bed pan with tot A. Pt return demonstrated RUE therex with yellow theraband. Pt's sons have not been present for OT education but have participated in PT therapy session.  Patient to discharge at overall {LOA:3049010} level.  Patient's care partner {care partner:3041650} to provide the necessary {assistance:3041652} assistance at discharge.    Reasons goals not met: limited by pain Recommendation:  Patient will benefit from ongoing skilled OT services in {setting:3041680} to continue to advance functional skills in the area of {ADL/iADL:3041649}.  Equipment: hoyer, DABS, hospital bed  Reasons for discharge: {Reason for discharge:3049018}  Patient/family agrees with progress made and goals achieved: {Pt/Family agree with progress/goals:3049020}  OT Discharge ADL ADL Equipment Provided: Reacher Eating: Set up Grooming: Setup Where Assessed-Grooming: Bed level Upper Body Bathing: Minimal assistance Where Assessed-Upper Body Bathing: Bed level Lower Body Bathing: Maximal assistance Where Assessed-Lower Body Bathing: Bed level Upper Body Dressing: Minimal assistance Where Assessed-Upper Body Dressing: Bed level Lower Body Dressing:  Dependent Where Assessed-Lower Body Dressing: Bed level Toileting: Dependent Where Assessed-Toileting: Bed level Toilet Transfer: Dependent Toilet Transfer Method: Other (comment) Toilet Transfer Equipment: Other (comment) Tub/Shower Transfer: Not assessed Film/video editor: Not assessed Vision Baseline Vision/History: 1 Wears glasses Patient Visual Report: No change from baseline Vision Assessment?: No apparent visual deficits Perception  Perception: Within Functional Limits Praxis Praxis: WFL Cognition Cognition Overall Cognitive Status: Within Functional Limits for tasks assessed Arousal/Alertness: Awake/alert Memory: Appears intact Awareness: Appears intact Problem Solving: Appears intact Safety/Judgment: Appears intact Brief Interview for Mental Status (BIMS) Repetition of Three Words (First Attempt): 3 Temporal Orientation: Year: Correct Temporal Orientation: Month: Accurate within 5 days Temporal Orientation: Day: Correct Recall: Sock: Yes, no cue required Recall: Blue: Yes, no cue required Recall: Bed: Yes, no cue required BIMS Summary Score: 15 Sensation Sensation Light Touch: Appears Intact Hot/Cold: Appears Intact Proprioception: Appears Intact Stereognosis: Appears Intact Coordination Gross Motor Movements are Fluid and Coordinated: No Fine Motor Movements are Fluid and Coordinated: Yes Finger Nose Finger Test: [facility] [employer] Motor  Motor Motor: Abnormal postural alignment and control Mobility  Bed Mobility Bed Mobility: Rolling Right;Rolling Left;Supine to Sit Rolling Right: Minimal Assistance - Patient > 75% Rolling Left: Minimal Assistance - Patient > 75% Supine to Sit: Moderate Assistance - Patient 50-74%  Trunk/Postural Assessment  Cervical Assessment Cervical Assessment: Exceptions to [facility] (forward head) Thoracic Assessment Thoracic Assessment: Exceptions to [facility] (rounded shoulders) Lumbar Assessment Lumbar Assessment: Exceptions to [facility]  (posterior pelvic tilt) Postural Control Postural Control: Deficits on evaluation Righting Reactions: delayed  Balance Static Sitting Balance Static Sitting - Balance Support: Feet supported Static Sitting - Level of Assistance: 4: Min assist;5: Stand by assistance Extremity/Trunk Assessment RUE Assessment RUE Assessment: Within Functional Limits Active Range  of Motion (AROM) Comments: wfl General Strength Comments: 4/5 LUE Assessment LUE Assessment: Exceptions to [facility] Active Range of Motion (AROM) Comments: AROM, shoulder okay, elbow, wrist, hand limited due to cast General Strength Comments: NT   Maritza Debby Mare 09/20/2024, 9:24 AM

## 2024-09-20 NOTE — Progress Notes (Signed)
 Physical Therapy Discharge Summary  Patient Details  Name: Rhonda Morales MRN: [id number] Date of Birth: 23-May-1951  Date of Discharge from PT service:September 20, 2024  Today's Date: 09/20/2024 PT Individual Time: 1300-1400 PT Individual Time Calculation (min): 60 min    Patient has met 1 of 8 long term goals due to change in WB status preventing further progress.  Patient to discharge at a wheelchair level Total Assist.   Patient's care partner is independent to provide the necessary physical assistance at discharge. Pt to d/c home with her husband and sons who have been trained on using hoyer lift.   Reasons goals not met: Pt to d/c home with hoyer and family to assist d/t change in WB precautions. She was unable to complete the rehab program d/t inability to bear weight through enough limbs to perform any form of independent transfer.   Recommendation:  Patient will benefit from ongoing skilled PT services in home health setting to continue to advance safe functional mobility, address ongoing impairments in transfer training, strength, ROM, and minimize fall risk.  Equipment: Deitra, w/c   Reasons for discharge: discharge from hospital  Patient/family agrees with progress made and goals achieved: Yes  PT Discharge Precautions/Restrictions Precautions Precautions: Fall;Posterior Hip Precaution/Restrictions Comments: reviewed posterior hip precautions LLE, per ortho posterior hip precautions x6 weeks and KI x2 weeks Required Braces or Orthoses: Knee Immobilizer - Left Knee Immobilizer - Left: On at all times Restrictions Weight Bearing Restrictions Per Provider Order: Yes LUE Weight Bearing Per Provider Order: Weight bear through elbow only RLE Weight Bearing Per Provider Order: Non weight bearing LLE Weight Bearing Per Provider Order: Touchdown weight bearing Vital Signs Therapy Vitals Temp: 99 F (37.2 C) Pulse Rate: 66 Resp: 16 BP: (!) 151/65 Patient Position (if  appropriate): Lying Oxygen Therapy SpO2: 95 % O2 Device: Room Air Pain   Pain Interference Pain Interference Pain Effect on Sleep: 3. Frequently Pain Interference with Therapy Activities: 3. Frequently;4. Almost constantly Pain Interference with Day-to-Day Activities: 4. Almost constantly Vision/Perception  Vision - History Ability to See in Adequate Light: 0 Adequate Perception Perception: Within Functional Limits Praxis Praxis: WFL  Cognition Overall Cognitive Status: Within Functional Limits for tasks assessed Arousal/Alertness: Awake/alert Orientation Level: Oriented X4 Sensation Sensation Light Touch: Appears Intact Hot/Cold: Appears Intact Proprioception: Appears Intact Stereognosis: Appears Intact Coordination Gross Motor Movements are Fluid and Coordinated: No Coordination and Movement Description: limited by pain and WB Motor  Motor Motor: Within Functional Limits  Mobility Bed Mobility Bed Mobility: Rolling Right;Rolling Left;Supine to Sit Rolling Right: Minimal Assistance - Patient > 75% Rolling Left: Minimal Assistance - Patient > 75% Supine to Sit: Supervision/Verbal cueing Transfers Transfers: Transfer;Transfer via Financial planner (Assistive device): Other (Comment) Locomotion  Gait Ambulation: No Gait Gait: No Stairs / Additional Locomotion Stairs: No Pick up small object from the floor (from standing position) activity did not occur: Safety/medical concerns Wheelchair Mobility Wheelchair Mobility: Yes Wheelchair Assistance: Dependent - Patient 0%  Trunk/Postural Assessment  Cervical Assessment Cervical Assessment: Within Functional Limits Thoracic Assessment Thoracic Assessment: Within Functional Limits Lumbar Assessment Lumbar Assessment: Within Functional Limits Postural Control Postural Control: Within Functional Limits  Balance Balance Balance Assessed: Yes Static Sitting Balance Static Sitting - Balance Support: Feet  supported Static Sitting - Level of Assistance: 4: Min assist;5: Stand by assistance Dynamic Sitting Balance Dynamic Sitting - Balance Support: Right upper extremity supported Dynamic Sitting - Level of Assistance: 5: Stand by assistance Extremity Assessment      RLE  Assessment General Strength Comments: grossly at least 4/5 hip and knee, ankle splinted. LLE Assessment General Strength Comments: hip grossly 2+/5, KI on at all times, ankle WFL.   Schuyler JAYSON Batter 09/20/2024, 4:11 PM
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
## 2024-09-20 NOTE — Progress Notes (Signed)
 Physical Therapy Session Note  Patient Details  Name: JENELLA CRAIGIE MRN: [id number] Date of Birth: 03-02-1951  Today's Date: 09/20/2024 PT Individual Time: 1300-1400 PT Individual Time Calculation (min): 60 min   Short Term Goals: Week 1:  PT Short Term Goal 1 (Week 1): Pt will transfer sup to sit w/ min A consistently from flat bed. PT Short Term Goal 2 (Week 1): Pt will transfer bed <> w/c w/ min A. PT Short Term Goal 3 (Week 1): PT to assess sit to stand/SPT w/ platform walker.  Skilled Therapeutic Interventions/Progress Updates:    pt received in bed and agreeable to therapy. Pain levels controlled with decreased activity on this date. Session focused on family education and hoyer training with pt's 2 sons. Pt's sons performed hoyer transfer bed<>w/c x 2 with cueing fading to supervision from therapist. Discussed pt's precautions at length as well as safety with hoyer lift. Pt able to assist in directing transfer as needed. Also provided education on positioning/ turning for skin protection and w/c safety including donning and doffing leg rests. Pt remained in bed at end of session, was left with all needs in reach and alarm active.   Therapy Documentation Precautions:  Precautions Precautions: Fall, Posterior Hip Precaution/Restrictions Comments: reviewed posterior hip precautions LLE, per ortho posterior hip precautions x6 weeks and KI x2 weeks Required Braces or Orthoses: Knee Immobilizer - Left Knee Immobilizer - Left: On at all times Restrictions Weight Bearing Restrictions Per Provider Order: Yes LUE Weight Bearing Per Provider Order: Weight bear through elbow only RLE Weight Bearing Per Provider Order: Non weight bearing LLE Weight Bearing Per Provider Order: Weight bearing as tolerated General:       Therapy/Group: Individual Therapy  Schuyler JAYSON Batter 09/20/2024, 3:49 PM

## 2024-09-20 NOTE — Progress Notes (Signed)
 Patient ID: Rhonda Morales, female   DOB: 1951/09/21, 73 y.o.   MRN: [id number]  SW met with pt and pt sons in room to inform on likely delay in discharge tomorrow due to issues with parachute and timeliness delivery of  DME. SW shared will confirm discharge tomorrow pending DME.  Graeme Jude, MSW, LCSW Office: [phone] Cell: [phone] Fax: [phone]

## 2024-09-20 NOTE — Progress Notes (Incomplete)
 Inpatient Rehabilitation Discharge Medication Review by a Pharmacist  A complete drug regimen review was completed for this patient to identify any potential clinically significant medication issues.  High Risk Drug Classes Is patient taking? Indication by Medication  Antipsychotic Yes  Seroquel - insomnia   Anticoagulant No   Antibiotic Yes Nitrofurantoin  - UTI px  Opioid Yes Oxycodone /tramadol - pain  Antiplatelet No   Hypoglycemics/insulin No   Vasoactive Medication Yes Atenolol  - HTN  Chemotherapy No   Other Yes Xanax  - anxiety Robaxin/flexeril  - spasms Protonix  - GERD Neurontin  - neuropathy    Type of Medication Issue Identified Description of Issue Recommendation(s)  Drug Interaction(s) (clinically significant)     Duplicate Therapy     Allergy     No Medication Administration End Date     Incorrect Dose     Additional Drug Therapy Needed     Significant med changes from prior encounter (inform family/care partners about these prior to discharge). PTA meds not resumed: hydroxyzine    Other       Clinically significant medication issues were identified that warrant physician communication and completion of prescribed/recommended actions by midnight of the next day:  No  Name of provider notified for urgent issues identified:   Provider Method of Notification:     Pharmacist comments:   Time spent performing this drug regimen review (minutes):  20

## 2024-09-21 ENCOUNTER — Other Ambulatory Visit (HOSPITAL_COMMUNITY): Payer: Self-pay

## 2024-09-21 DIAGNOSIS — G8918 Other acute postprocedural pain: Secondary | ICD-10-CM

## 2024-09-21 DIAGNOSIS — K5901 Slow transit constipation: Secondary | ICD-10-CM

## 2024-09-21 MED ORDER — HYDROXYZINE HCL 25 MG PO TABS
25.0000 mg | ORAL_TABLET | Freq: Two times a day (BID) | ORAL | 0 refills | Status: AC | PRN
  Filled 2024-09-21: qty 30, 15d supply, fill #0

## 2024-09-21 MED ORDER — OXYCODONE HCL 10 MG PO TABS
10.0000 mg | ORAL_TABLET | ORAL | 0 refills | Status: DC | PRN
  Filled 2024-09-21: qty 48, 8d supply, fill #0

## 2024-09-21 MED ORDER — QUETIAPINE FUMARATE 25 MG PO TABS
25.0000 mg | ORAL_TABLET | Freq: Every day | ORAL | 0 refills | Status: AC
  Filled 2024-09-21: qty 30, 15d supply, fill #0

## 2024-09-21 MED ORDER — DICLOFENAC SODIUM 1 % EX GEL
2.0000 g | Freq: Four times a day (QID) | CUTANEOUS | 0 refills | Status: AC | PRN
  Filled 2024-09-21: qty 100, 13d supply, fill #0

## 2024-09-21 MED ORDER — NALOXONE HCL 4 MG/0.1ML NA LIQD
0.4000 mg | NASAL | 0 refills | Status: AC | PRN
  Filled 2024-09-21: qty 2, 2d supply, fill #0
  Filled 2024-09-21: qty 2, fill #0

## 2024-09-21 MED ORDER — POLYETHYLENE GLYCOL 3350 17 GM/SCOOP PO POWD
17.0000 g | Freq: Every day | ORAL | 0 refills | Status: AC | PRN
  Filled 2024-09-21: qty 238, 14d supply, fill #0

## 2024-09-21 MED ORDER — OXYCODONE HCL 15 MG PO TABS
15.0000 mg | ORAL_TABLET | ORAL | 0 refills | Status: DC | PRN
  Filled 2024-09-21: qty 30, 5d supply, fill #0

## 2024-09-21 MED ORDER — OXYCODONE HCL 15 MG PO TABS
15.0000 mg | ORAL_TABLET | ORAL | 0 refills | Status: AC | PRN
  Filled 2024-09-21: qty 42, 7d supply, fill #0

## 2024-09-21 MED ORDER — OXYCODONE HCL ER 10 MG PO T12A
10.0000 mg | EXTENDED_RELEASE_TABLET | Freq: Two times a day (BID) | ORAL | 0 refills | Status: DC
  Filled 2024-09-21: qty 14, 7d supply, fill #0

## 2024-09-21 MED ORDER — OXYCODONE HCL ER 10 MG PO T12A
10.0000 mg | EXTENDED_RELEASE_TABLET | Freq: Two times a day (BID) | ORAL | 0 refills | Status: AC
  Filled 2024-09-21: qty 14, 7d supply, fill #0

## 2024-09-21 MED ORDER — METHOCARBAMOL 500 MG PO TABS
1000.0000 mg | ORAL_TABLET | Freq: Three times a day (TID) | ORAL | 0 refills | Status: AC
  Filled 2024-09-21: qty 60, 10d supply, fill #0

## 2024-09-21 MED ORDER — ACETAMINOPHEN 325 MG PO TABS: 325.0000 mg | ORAL_TABLET | ORAL | Status: AC | PRN

## 2024-09-21 MED ORDER — GABAPENTIN 300 MG PO CAPS
600.0000 mg | ORAL_CAPSULE | Freq: Three times a day (TID) | ORAL | 0 refills | Status: AC
  Filled 2024-09-21: qty 180, 30d supply, fill #0

## 2024-09-21 MED ORDER — DOCUSATE SODIUM 100 MG PO CAPS
100.0000 mg | ORAL_CAPSULE | Freq: Two times a day (BID) | ORAL | 0 refills | Status: AC
  Filled 2024-09-21: qty 10, 5d supply, fill #0

## 2024-09-21 MED ORDER — CYCLOBENZAPRINE HCL 10 MG PO TABS
10.0000 mg | ORAL_TABLET | Freq: Three times a day (TID) | ORAL | 0 refills | Status: AC | PRN
  Filled 2024-09-21: qty 30, 10d supply, fill #0

## 2024-09-21 MED ORDER — ENSURE PLUS HIGH PROTEIN PO LIQD: 237.0000 mL | Freq: Three times a day (TID) | ORAL | Status: AC

## 2024-09-21 MED ORDER — NITROFURANTOIN MACROCRYSTAL 50 MG PO CAPS: 50.0000 mg | ORAL_CAPSULE | Freq: Every day | ORAL | Status: AC

## 2024-09-21 NOTE — Progress Notes (Signed)
 Inpatient Rehabilitation Care Coordinator Discharge Note   Patient Details  Name: Rhonda Morales MRN: [id number] Date of Birth: Jul 30, 1951   Discharge location: D/c to home  Length of Stay: 7 days  Discharge activity level: Total Asst  Home/community participation: Limited  Patient response un:Yzjouy Literacy - How often do you need to have someone help you when you read instructions, pamphlets, or other written material from your doctor or pharmacy?: Rarely  Patient response un:Dnrpjo Isolation - How often do you feel lonely or isolated from those around you?: Rarely  Services provided included: PT, RD, OT, RN, CM, SW, Neuropsych, Pharmacy, TR, MD  Financial Services:  Financial Services Utilized: Medicare    Choices offered to/list presented to: Patient  Follow-up services arranged:  Home Health, DME Home Health Agency: Deferred unit [facility] status changes    DME : [facility] for hospital bed, hoyer lift, 3in1 BSC and wheelchair    Patient response to transportation need: Is the patient able to respond to transportation needs?: Yes In the past 12 months, has lack of transportation kept you from medical appointments or from getting medications?: No In the past 12 months, has lack of transportation kept you from meetings, work, or from getting things needed for daily living?: No   Patient/Family verbalized understanding of follow-up arrangements:  Yes  Individual responsible for coordination of the follow-up plan: contact pt wife  Confirmed correct DME delivered: Graeme DELENA Jude 09/21/2024    Comments (or additional information):fam edu completed  Summary of Stay    Date/Time Discharge Planning CSW  09/14/24 1011 TBA AAC       Zalmen Wrightsman A [employer]

## 2024-09-21 NOTE — Progress Notes (Signed)
 Patient ID: Rhonda Morales, female   DOB: 05-14-1951, 73 y.o.   MRN: [id number]  8am- SW received updates from [facility] that copay has not been made.   SW called pt to inform on copay not made for DME.  *SW spoke with pt sons Todd/Marty to inform on copay for DME.   DME delivered to the room.    Graeme Jude, MSW, LCSW Office: [phone] Cell: [phone] Fax: [phone]

## 2024-09-21 NOTE — Progress Notes (Signed)
 Inpatient Rehabilitation Discharge Medication Review by a Pharmacist  A complete drug regimen review was completed for this patient to identify any potential clinically significant medication issues.  High Risk Drug Classes Is patient taking? Indication by Medication  Antipsychotic Yes  Seroquel - insomnia   Anticoagulant No   Antibiotic Yes Nitrofurantoin  - UTI px  Opioid Yes Oxycodone  - pain  Antiplatelet No   Hypoglycemics/insulin No   Vasoactive Medication Yes Atenolol  - HTN  Chemotherapy No   Other Yes Acetaminophen  - pain Diclofenac Gel - pain Docusate - bowel regimen Hydroxyzine - anxiety PEG - bowel regimen Xanax  - anxiety Robaxin - spasms Flexeril  - spasms Ondansetron  - nausea Protonix  - GERD Naloxone - opioid reversal Gabapentin  - neuropathy    Type of Medication Issue Identified Description of Issue Recommendation(s)  Drug Interaction(s) (clinically significant)     Duplicate Therapy     Allergy     No Medication Administration End Date     Incorrect Dose     Additional Drug Therapy Needed     Significant med changes from prior encounter (inform family/care partners about these prior to discharge).    Other       Clinically significant medication issues were identified that warrant physician communication and completion of prescribed/recommended actions by midnight of the next day:  No  Name of provider notified for urgent issues identified:   Provider Method of Notification:     Pharmacist comments:   Time spent performing this drug regimen review (minutes):  20  Venba Zenner, Pharm.D., BCPS Clinical Pharmacist Clinical phone for 09/21/2024 from 7:30-3:00 is [phone].  **Pharmacist phone directory can be found on [email or web address] listed under [facility].  09/21/2024 11:09 AM

## 2024-09-21 NOTE — Plan of Care (Signed)
  Problem: RH BOWEL ELIMINATION Goal: RH STG MANAGE BOWEL WITH ASSISTANCE Description: STG Manage Bowel with minimal Assistance. Outcome: Progressing   Problem: RH SKIN INTEGRITY Goal: RH STG SKIN FREE OF INFECTION/BREAKDOWN Description: Manage skin free of infection/ breakdown with min assistance Outcome: Progressing   Problem: RH PAIN MANAGEMENT Goal: RH STG PAIN MANAGED AT OR BELOW PT'S PAIN GOAL Description: <4 w/ prns Outcome: Progressing   Problem: RH KNOWLEDGE DEFICIT GENERAL Goal: RH STG INCREASE KNOWLEDGE OF SELF CARE AFTER HOSPITALIZATION Outcome: Progressing

## 2024-09-21 NOTE — Progress Notes (Addendum)
 Educated patient on dressing changes  and Home supplies given to patient for wound care.

## 2024-09-21 NOTE — Discharge Summary (Signed)
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 assistance to transfer. Mod A to total A with mobility and basic ADLs. Therapy evaluations  completed due to patient decreased functional mobility was admitted for a comprehensive rehab program.      Inpatient Rehabilitation Course: RASHAUNA TEP was admitted to rehab 09/13/2024 for inpatient therapies to consist of PT, ST and OT at least three hours five days a week. Past admission physiatrist, therapy team and rehab RN have worked together to provide customized collaborative inpatient rehab.  Anticoagulation: Antiembolism stockings maintain with TED hose while inpatient and Lovenox  for DVT prophylaxis.  Pain Management: Multimodal pain relief measures via scheduled maintenance with Robaxin, gabapentin , oxycodone  and Flexeril  as needed.  Continue tramadol every 6 as needed.  Voltaren gel for left knee pain, history of allergy to NSAIDs.  Mood/Behavior/Sleep: History of anxiety and depression maintained on alprazolam  twice daily as needed.  Continue home Seroquel  12.5 mg at bedtime.    Skin/Wound Care: Wound care/signs and symptoms of infection discussed at discharge.   GERD: Continue PPI  Hypertension: Atenolol  50 mg, notable spikes in BP related to pain otherwise stable.  Type III open bimalleolar ankle fracture: Status post open reduction and internal fixation by Dr. Sharl 10/9.  Continue nonweightbearing in the right lower extremity.  Open long finger fracture: Status post reduction and pin fixation with irrigation and debridement by Dr. Murrell on 10/9.  Traumatic right knee arthrotomy: Knee immobilizer maintain for 2 weeks as recommended by orthopedics.  Plan to continue until follow-up outpatient.  Closed fourth metacarpal base fracture: Nonoperative management recommended.  Maintain splint.  Nonweightbearing left upper extremity.  Left knee pain: Imaging revealed moderate left knee effusion.  Orthopedics consulted with recommendations for MRI. Left knee MRI revealed severe impaction fracture, therefore injection contraindicated. WBAT recommended along with Voltaren gel QID.   Left hip dislocation: Status post closed reduction posterior hip precautions x 6 weeks.  Patient unable to to tolerate WBAT on left knee.  TDWB due to pain control. 10/15 postreduction x-ray shows superior periprosthetic iliac to the post.  Medial aspect of the left iliac  Chronic interstitial cystitis: Macrodantin  50 mg daily per home regimen.  Anemia: Hemoglobin stable, continue to monitor with follow-up labs outpatient.   Severe nausea: continue ondansetron  4 mg TID.   Constipation: continue bowl management.    Planned Outpatient Follow-Up:   [facility] Orthopedic Surgery    Rehab course: During patient's stay in rehab weekly team conferences were held to monitor patient's progress, set goals and discuss barriers to discharge. At admission, patient required Mod assist with +2 physical assistance to transfer. Mod A to total A with mobility and basic ADLs.    Occupational Therapy:  Patient has met 2 of 8 long term goals due to improved activity tolerance, improved balance.  Progress was limited by pain and change in LLE WB status during admission.  Patient completes ADLs at bed level but is unable to tolerate sitting in wheelchair due to left hip pain.  She currently requires to be transfers with total assist, bathing with mod a at bed level, upper body dressing with min a and lower body dressing with total a at bed level.  Manual [place] lift for transfers is recommended at home.  Patient continues using bedpan with total assist.  The patient's son has not been present for OT education but has participated in PT therapy sessions.   She will benefit from ongoing OT in home health setting to continue to advance functional skills in the area of BADL.   Physical Therapy: Patient has met 1 of 8 long term goals due weight baring status  preventing further progress.  Patient to discharge at an wheelchair level total assist..  She was unable to complete the rehab program due to inability to bear weight through lower limbs to perform any form of independent transfer.  Patient's husband and son have been trained on [place] lift use.  She will benefit from ongoing skilled PT services in home health setting to continue to advance safe functional mobility, address ongoing impairments in strength, ROM, balance, endurance, gait, and minimize fall risk.   Discharge plan was discussed with patient and/or family member and they verbalized understanding and agreed with it.      Disposition: Discharge disposition: 06-Home-Health Care Svc        Diet: Regular   Special Instructions:  -No driving or operating heavy machinery until cleared by provider  -No smoking or alcohol or illicit drug use     Allergies as of 09/21/2024       Reactions   Hydrocodone Itching, Nausea Only   Atenolol  Other (See Comments)   Estradiol Other (See Comments)   Cream burned   Morphine  And Codeine    Unsure  of type of reaction   Nsaids    Other Reaction(s): Unknown   Penicillin G Other (See Comments)        Medication List     TAKE these medications    acetaminophen  325 MG tablet Commonly known as: TYLENOL  Take 1-2 tablets (325-650 mg total) by mouth every 4 (four) hours as needed for mild pain (pain score 1-3).   ALPRAZolam  0.5 MG tablet Commonly known as: XANAX  Take 0.25 mg by mouth 2 (two) times daily as needed for anxiety.   atenolol  50 MG tablet Commonly known as: TENORMIN  Take 50 mg by mouth daily.   cyclobenzaprine  10 MG tablet Commonly known as: FLEXERIL  Take 1 tablet (10 mg total) by mouth 3 (three) times daily as needed for muscle spasms.   diclofenac Sodium 1 % Gel Commonly known as: VOLTAREN Apply 2 g topically 4 (four) times daily as needed (LEFT KNEE PAIN).   docusate sodium 100 MG capsule Commonly known as: COLACE Take 1 capsule (100 mg total) by mouth 2 (two) times daily.   feeding supplement Liqd Take 237 mLs by mouth 3 (three) times daily between meals.   gabapentin  300 MG capsule Commonly known as: NEURONTIN  Take 2 capsules (600 mg total) by mouth 3 (three) times daily. What changed: how much to take   hydrOXYzine 25 MG tablet Commonly known as: ATARAX Take 1 tablet (25 mg total) by mouth 2 (two) times daily as needed for anxiety.   methocarbamol 500 MG tablet Commonly known as: ROBAXIN Take 2 tablets (1,000 mg total) by mouth every 8 (eight) hours.   naloxone 4 MG/0.1ML Liqd nasal spray kit Commonly known as: NARCAN Place 0.1 sprays (0.4 mg total) into the nose as needed. TO BE USED FOR OPIOID OVERDOSE   nitrofurantoin  50 MG capsule Commonly known as: MACRODANTIN  Take 1 capsule (50 mg total) by mouth at bedtime. What changed: Another medication with the same name was removed. Continue taking this medication, and follow the directions you see here.   ondansetron  4 MG tablet Commonly known as: ZOFRAN  Take 4 mg by mouth 3 (three) times  daily as needed for nausea or vomiting.   oxyCODONE  15 MG immediate release tablet Commonly known as: ROXICODONE  Take 1 tablet (15 mg total) by mouth every 4 (four) hours as needed. Notes to patient: Limit to 4 times a day   oxyCODONE   10 mg 12 hr tablet Commonly known as: OXYCONTIN  Take 1 tablet (10 mg total) by mouth every 12 (twelve) hours.   pantoprazole  40 MG tablet Commonly known as: PROTONIX  Take 1 tablet (40 mg total) by mouth daily before breakfast.   polyethylene glycol powder 17 GM/SCOOP powder Commonly known as: GLYCOLAX /MIRALAX  Dissolve 1 capful (17g) in 4-8 ounces of liquid and take by mouth daily as needed (constipation).   QUEtiapine  25 MG tablet Commonly known as: SEROQUEL  Take 1-2 tablets (25-50 mg total) by mouth at bedtime. What changed: how much to take        Follow-up Information     McClung, Kevan D, [place]. Call today.   Why: Call and make appointment to be seen around 10/23 or 10/28 with Kevan McClung PA-C Contact information: [address] [place] [address] [phone]         Murrell Drivers, MD Follow up.   Specialty: Orthopedic Surgery Why: Call for an appointment. Contact information: [address] [place] [address] [phone]         Sharl Selinda Dover, MD Follow up.   Specialty: Orthopedic Surgery Why: Call for an appointment. Contact information: [address] [place] [address] [place] [address] [phone]                 Signed: Daphne LOISE Satterfield 09/21/2024, 11:46 AM
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
## 2024-12-15 ENCOUNTER — Ambulatory Visit: Admitting: Urology
